# Patient Record
Sex: Male | Born: 1955
Health system: Southern US, Community
[De-identification: ages and names within clinical notes are randomized; demographics above are authoritative.]

## PROBLEM LIST (undated history)

## (undated) DIAGNOSIS — M1990 Unspecified osteoarthritis, unspecified site: Secondary | ICD-10-CM

## (undated) DIAGNOSIS — R350 Frequency of micturition: Secondary | ICD-10-CM

## (undated) DIAGNOSIS — R519 Headache, unspecified: Secondary | ICD-10-CM

## (undated) DIAGNOSIS — G4730 Sleep apnea, unspecified: Secondary | ICD-10-CM

## (undated) DIAGNOSIS — K219 Gastro-esophageal reflux disease without esophagitis: Secondary | ICD-10-CM

## (undated) DIAGNOSIS — Z87442 Personal history of urinary calculi: Secondary | ICD-10-CM

## (undated) DIAGNOSIS — K56609 Unspecified intestinal obstruction, unspecified as to partial versus complete obstruction: Secondary | ICD-10-CM

## (undated) DIAGNOSIS — R51 Headache: Secondary | ICD-10-CM

## (undated) HISTORY — PX: CYST EXCISION: SHX5701

## (undated) HISTORY — PX: HERNIA REPAIR: SHX51

---

## 1999-11-07 ENCOUNTER — Ambulatory Visit (HOSPITAL_COMMUNITY): Admission: RE | Admit: 1999-11-07 | Discharge: 1999-11-07 | Payer: Self-pay | Admitting: Cardiology

## 2004-04-22 ENCOUNTER — Ambulatory Visit: Payer: Self-pay | Admitting: Pulmonary Disease

## 2004-04-22 ENCOUNTER — Ambulatory Visit: Admission: RE | Admit: 2004-04-22 | Discharge: 2004-04-22 | Payer: Self-pay | Admitting: *Deleted

## 2004-05-15 ENCOUNTER — Ambulatory Visit: Payer: Self-pay | Admitting: Pulmonary Disease

## 2004-05-30 ENCOUNTER — Other Ambulatory Visit: Admission: RE | Admit: 2004-05-30 | Discharge: 2004-05-30 | Payer: Self-pay | Admitting: Dermatology

## 2007-02-24 ENCOUNTER — Ambulatory Visit: Payer: Self-pay | Admitting: Cardiovascular Disease

## 2007-02-25 ENCOUNTER — Encounter: Payer: Self-pay | Admitting: Cardiovascular Disease

## 2007-02-25 ENCOUNTER — Ambulatory Visit: Payer: Self-pay | Admitting: Cardiology

## 2007-02-25 ENCOUNTER — Encounter (HOSPITAL_COMMUNITY): Admission: RE | Admit: 2007-02-25 | Discharge: 2007-03-27 | Payer: Self-pay | Admitting: Cardiovascular Disease

## 2007-02-26 ENCOUNTER — Ambulatory Visit: Payer: Self-pay | Admitting: Pulmonary Disease

## 2007-04-01 ENCOUNTER — Ambulatory Visit: Payer: Self-pay | Admitting: Pulmonary Disease

## 2007-07-06 ENCOUNTER — Ambulatory Visit (HOSPITAL_COMMUNITY): Admission: RE | Admit: 2007-07-06 | Discharge: 2007-07-06 | Payer: Self-pay | Admitting: General Surgery

## 2007-10-11 ENCOUNTER — Encounter: Payer: Self-pay | Admitting: Pulmonary Disease

## 2007-11-15 ENCOUNTER — Encounter: Payer: Self-pay | Admitting: Pulmonary Disease

## 2007-11-15 DIAGNOSIS — R519 Headache, unspecified: Secondary | ICD-10-CM | POA: Insufficient documentation

## 2007-11-15 DIAGNOSIS — E785 Hyperlipidemia, unspecified: Secondary | ICD-10-CM | POA: Insufficient documentation

## 2007-11-15 DIAGNOSIS — R51 Headache: Secondary | ICD-10-CM | POA: Insufficient documentation

## 2007-11-15 DIAGNOSIS — I2510 Atherosclerotic heart disease of native coronary artery without angina pectoris: Secondary | ICD-10-CM | POA: Insufficient documentation

## 2007-11-15 DIAGNOSIS — G4730 Sleep apnea, unspecified: Secondary | ICD-10-CM | POA: Insufficient documentation

## 2008-01-10 ENCOUNTER — Ambulatory Visit (HOSPITAL_COMMUNITY): Admission: RE | Admit: 2008-01-10 | Discharge: 2008-01-10 | Payer: Self-pay | Admitting: General Surgery

## 2008-04-25 ENCOUNTER — Ambulatory Visit (HOSPITAL_COMMUNITY): Admission: RE | Admit: 2008-04-25 | Discharge: 2008-04-25 | Payer: Self-pay | Admitting: Family Medicine

## 2008-10-09 IMAGING — NM NM MYOCAR MULTI W/ SPECT
2 series · 12 of 12 positions shown · non-contrast
Comparison: none

CLINICAL DATA: 51-year-old gentleman with no known coronary disease presenting with chest pain.   
 STRESS MYOVIEW:
 Radionuclide Data:  One day rest/stress protocol performed with [DATE] mCi Mc-OOm Myoview.
 Stress Data:  Treadmill exercise performed to a workload of 12 mets and a heart rate of 155, 91% of age-predicted maximum.  Exercise discontinued due to fatigue and dyspnea; no chest pain reported.  Blood pressure increased from a resting value of 120/80 to 170/80 during exercise and 180/80 early in recovery, a normal response.  No arrhythmias noted.
 EKG:  Normal sinus rhythm; slightly delayed R-wave progression; right ventricular conduction delay; otherwise unremarkable.
 Stress EKG:  Insignificant upsloping ST segment depression.
 Scintigraphic Data:  Acquisition notable for mild diaphragmatic attenuation.  The left ventricular size was normal.  On tomographic images reconstructed in standard planes, there was basically normal myocardial perfusion.  Minor apical thinning was present that was not numerically significant and for which no reversibility was apparent.  The gated reconstruction demonstrated normal regional and global LV systolic function as well as normal systolic accentuation of activity throughout.  Estimated ejection fraction was 0.61.

[Series 1: cardiac perfusion · 6.39mm/px · 6 of 64 frames shown]
[frame 6/64]
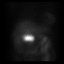
[frame 16/64]
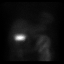
[frame 27/64]
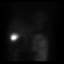
[frame 38/64]
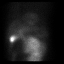
[frame 48/64]
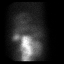
[frame 59/64]
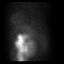

[Series 1: cs cardiac tc hi dose · 6.52mm/px · 6 of 512 frames shown]
[frame 43/512]
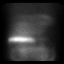
[frame 128/512]
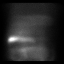
[frame 214/512]
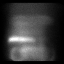
[frame 299/512]
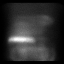
[frame 384/512]
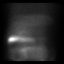
[frame 470/512]
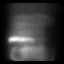

[12 of 12 positions shown; findings below may reference images not displayed]

IMPRESSION: Negative stress Myoview examination revealing good exercise tolerance, normal left ventricular size, normal left ventricular systolic function and neither electrocardiographic nor scintigraphic evidence for myocardial ischemia or infarction.  Physiological apical thinning.  Other findings as noted.

## 2008-12-11 DIAGNOSIS — R002 Palpitations: Secondary | ICD-10-CM | POA: Insufficient documentation

## 2008-12-11 DIAGNOSIS — R079 Chest pain, unspecified: Secondary | ICD-10-CM | POA: Insufficient documentation

## 2010-09-26 ENCOUNTER — Ambulatory Visit (INDEPENDENT_AMBULATORY_CARE_PROVIDER_SITE_OTHER): Payer: Federal, State, Local not specified - PPO | Admitting: Internal Medicine

## 2010-09-26 DIAGNOSIS — R1310 Dysphagia, unspecified: Secondary | ICD-10-CM

## 2010-10-08 NOTE — Consult Note (Signed)
NAME:  Kevin Patton, Kevin Patton             ACCOUNT NO.:  [id number]  MEDICAL RECORD NO.:  [id number]          PATIENT TYPE:  LOCATION:                                 FACILITY:  PHYSICIAN:  Lionel December, M.D.    DATE OF BIRTH:  1956-03-07  DATE OF CONSULTATION: DATE OF DISCHARGE:                                CONSULTATION   REASON FOR CONSULTATION:  Dysphagia.  HISTORY OF PRESENT ILLNESS:  Kevin Patton is a 55 year old white male referred to our office by Dr. Regino Schultze.  Kevin Patton states foods are occasionally lodging in his esophagus.  He states foods are lodging in his lower esophagus.  He does occasionally have to vomit his food back up.  He does occasionally have acid reflux.  He states the dysphagia is occurring about every 2 weeks.  He says approximately 5 times he has had to vomit the bolus back up if it would not go down.  He says his appetite is good.  He has had no weight loss.  He does occasionally have heartburn.  He has had no melena or bright red rectal bleeding.  There has been no weight loss.  He usually has a bowel movement once a day. There are no known allergies.  HOME MEDICATIONS: 1. Ropinirole 2 mg at night. 2. Simvastatin 20 mg a day. 3. Slo-Niacin 500 mg 3 a day. 4. Vitamin D3 1000 units a day. 5. MVI a day. 6. Aspirin 325 a day. 7. Caffeine 1000 mg a day.  SURGERY:  He had an umbilical hernia repair 3 years ago.  He has had a pilonidal cyst repaired for I and D.  MEDICAL HISTORY:  High cholesterol, sleep apnea, frequent headaches and restless leg syndrome.  FAMILY HISTORY:  His mother deceased from pulmonary issues.  Father is deceased from an enlarged heart.  One sister with back problems.  One brother in good health.  SOCIAL HISTORY:  He is married.  He works at the post office.  He does not smoke, drink or do drugs.  He has three children in good health. One child does however have seizures.  OBJECTIVE:  VITAL SIGNS:  His weight is 250, his height is 6  feet 2 inches, his temperature is 98.5, his blood pressure is 104/76, his pulse is 72. HEENT:  He has natural teeth in good condition.  His oral mucosa is moist.  There are no lesions.  His conjunctivae are pink.  His sclerae are anicteric. NECK:  His thyroid is normal.  There is no cervical lymphadenopathy. LUNGS:  Clear. HEART:  Regular, rate and rhythm. ABDOMEN:  Soft.  Bowel sounds are positive.  No masses. EXTREMITIES:  There is no edema to his extremities.  ASSESSMENT:  Kevin Patton is a 55 year old male presenting with solid food dysphagia.  An esophageal stricture web needs to be ruled out.  RECOMMENDATIONS:  We will start him on omeprazole 40 mg 30 minutes before breakfast #30 with 11 refills and I will also schedule an EGD/ED with Dr. Karilyn Cota in the near future and the risk and benefits were reviewed with the patient and he is agreeable.    ______________________________ Camelia Eng  Valetta Fuller, NP   ______________________________ Lionel December, M.D.    TS/MEDQ  D:  09/27/2010  T:  09/28/2010  Job:  [id number]  cc:   Kirk Ruths, M.D. Fax: [phone]  Electronically Signed by Dorene Ar PA on 10/02/2010 04:59:47 PM Electronically Signed by Lionel December M.D. on 10/08/2010 11:35:14 PM

## 2010-11-07 ENCOUNTER — Ambulatory Visit (HOSPITAL_COMMUNITY)
Admission: RE | Admit: 2010-11-07 | Discharge: 2010-11-07 | Disposition: A | Discharge status: Home / Self Care | Payer: Federal, State, Local not specified - PPO | Source: Ambulatory Visit | Attending: Internal Medicine | Admitting: Internal Medicine

## 2010-11-07 ENCOUNTER — Encounter (HOSPITAL_BASED_OUTPATIENT_CLINIC_OR_DEPARTMENT_OTHER): Payer: Federal, State, Local not specified - PPO | Admitting: Internal Medicine

## 2010-11-07 ENCOUNTER — Other Ambulatory Visit (INDEPENDENT_AMBULATORY_CARE_PROVIDER_SITE_OTHER): Payer: Self-pay | Admitting: Internal Medicine

## 2010-11-07 DIAGNOSIS — K209 Esophagitis, unspecified without bleeding: Secondary | ICD-10-CM

## 2010-11-07 DIAGNOSIS — R1310 Dysphagia, unspecified: Secondary | ICD-10-CM | POA: Insufficient documentation

## 2010-11-07 DIAGNOSIS — K2970 Gastritis, unspecified, without bleeding: Secondary | ICD-10-CM

## 2010-11-07 DIAGNOSIS — E785 Hyperlipidemia, unspecified: Secondary | ICD-10-CM | POA: Insufficient documentation

## 2010-11-08 LAB — H. PYLORI ANTIBODY, IGG: H Pylori IgG: 0.45 {ISR}

## 2010-11-12 NOTE — Assessment & Plan Note (Signed)
[facility]                             PULMONARY OFFICE NOTE   OLLIVER, BOYADJIAN                      MRN:          [id number]  DATE:02/26/2007                            DOB:          [birth year]    REFERRING PHYSICIAN:  Noralyn Pick. Eden Emms, MD, [facility]   HISTORY OF PRESENT ILLNESS:  The patient is a 55 year old gentleman who  comes in today for further treatment and evaluation of obstructive sleep  apnea.  I have apparently seen him in the past for mild to moderate  obstructive apnea.  However, there is no sleep study or prior sleep  notes available with the chart in [place].  The patient states that  he was on CPAP for about 8-10 months, and he stopped it because he just  felt like he was not doing any better and was having tolerance issues.  Again I have no way of knowing whether he failed to follow up with [place] to  troubleshoot the device or whether we had tried various things to try  and make tolerance better.  The patient has been off CPAP for about a  year and a half to two years.  The patient states that he continues to  have pauses in his breathing during sleep and is snoring currently.  He  is having definite daytime sleepiness, and he is drinking a lot of  coffee in order to stay awake at work.  He typically goes to bed between  10 and 12 and gets up at 7 a.m. to start his day.  He is obviously not  rested.  The patient also gives symptoms for the restless leg syndrome  in the evenings and also with leg jerks and abnormal sensations as he is  going off to sleep.  It is unclear to him whether he does kick during  the night.  His sleep study did apparently show large numbers of leg  jerks.  In terms of CPAP intolerance the patient states that he used a  nasal mask, but thinks that he would do better with a full face mask if  we tried again.  He stated that sound bothered his wife, and he reminded  me that the CPAP machine he used was an old one  from when his dad was  alive.  He also had sleep apnea.  He never did have his own machine.  He  does describe problems with exhalation, and it sounds from his history  that we really never got him optimally titrated and that he had  difficulties with air hunger on inspiration.  Note the patient's weight  is up about 20 pounds over the last two years.   PAST MEDICAL HISTORY:  Noted for:  1. Dyslipidemia.  2. History of chronic headaches.  3. He denies any other medical history to me.  It is really unclear      why he has seen cardiology up in [place].   CURRENT MEDICATIONS:  1. Niaspan 500 daily.  2. Zocor 20 mg daily.  3. Aspirin 325 daily.  4. Multivitamin daily.   ALLERGIES:  The patient has no known drug allergies.   SOCIAL HISTORY:  He is married, has children.  He has never smoked.   FAMILY HISTORY:  Remarkable for his father having heart disease as well  as prostate cancer.   REVIEW OF SYSTEMS:  As per History of Present Illness.  Also see patient  intake form documented on the chart.   PHYSICAL EXAMINATION:  GENERAL:  He is an overweight male in no acute  distress.  VITAL SIGNS:  Blood pressure 124/80, pulse 69, temperature 98.4, weight  241 pounds, 6 feet 2 inches tall, O2 saturation on room air 95%.  HEENT:  Pupils equal, round, reactive to light and accommodation,  extraocular muscles are intact.  Nares show septal deviation at the left  with total obstruction over the left side.  Oropharynx does show  moderate elongation of soft palate and uvula.  NECK:  Supple without JVD or lymphadenopathy, no palpable thyromegaly.  CHEST:  Totally clear.  CARDIAC:  Reveals regular rate and rhythm, no murmurs, rubs or gallops.  ABDOMEN:  Soft, nontender with good bowel sounds.  GENITAL/RECTAL/BREASTS:  Not done and not indicated.  EXTREMITIES:  Lower extremities are without edema, pulses are intact  distally.  NEUROLOGIC:  Alert, oriented with no obvious motor  deficits.   IMPRESSION:  History of what the patient believes is mild to moderate  obstructive sleep apnea.  We do need to get his old data as well as my  office notes to verify this.  He has had difficulties with CPAP part of  which I believe is trouble on exhalation as well as possibly never  having gotten his pressure titrated optimally.  He also started out on a  nasal mask, but my guess is that he would do much better with a full  face mask at this time.  The patient is willing to continue wearing CPAP  in order to feel more rested the next day.   PLAN:  1. I have encouraged him to work on weight loss which will certainly      help things.  2. Will reinitiate positive pressure device with a full face mask.  3. Will go ahead and get the patient on an auto BiPAP rather than CPAP      in light of his difficulties with the pressure on exhalation.      Perhaps he will do better with this.  Once he has worn this for 3-4      weeks and we have troubleshooted all of his problems, will get a      download off that machine and get him on his own regular BiPAP      machine at home.  The patient is agreeable to this approach.  4. Follow up in four weeks or sooner if there are problems.     Barbaraann Share, MD,FCCP  Electronically Signed    [facility]/MedQ  DD: 02/26/2007  DT: 02/27/2007  Job #: [id number]   cc:   Noralyn Pick. Eden Emms, MD, [facility]

## 2010-11-12 NOTE — Assessment & Plan Note (Signed)
[facility]                       [place] CARDIOLOGY OFFICE NOTE   NOLON, YELLIN                      MRN:          [id number]  DATE:02/24/2007                            DOB:          1955-07-30    Kevin Patton is seen today as a new patient.  He had previously been  seen back in 2000/2001.  His primary care doctor is Dr. Regino Schultze.  The  patient came to the office to be seen for chest pain and palpitations.   The patient had a normal catheterization in May 2001.   His coronary risk factors include hypercholesterolemia and borderline  hypertension.   He is a nonsmoker, nondiabetic.  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  At that time, he noticed his heart racing.  He had  rapid palpitations for 10 to 15 minutes.  They slowly eased off on their  own.  Around the time he had his rapid palpitations, he had atypical  chest pain.  He noted sharp left-sided pain that radiated up to his  neck.  It seemed to get better as his palpitations went away.  The  patient thinks that possible recurrent sleep apnea and caffeine are  contributing to the problems.  He is having a lot of difficulty  sleeping.  He snores a lot.  He has seen Dr. Shelle Iron in the past, but  apparently had difficulties with wearing his mask, and has not done it.  He thinks this is a recurrent problem.  Because of his problem sleeping,  he has to drink a lot of caffeine to stay awake, and he thinks this may  be exacerbating his palpitations.   The patient has gotten recurrent pains without the palpitations.  They  have recurred over the weekend.  They are nonexertional.  They are  sharp.  They last a minute or 2.  He does not do anything to make them  better or worse.  The pain can radiate towards the neck, but sometimes  just stays in the chest.   The patient does not have a history of syncope, PND, or orthopnea.  There has been no previous cardiomyopathy or  heart problems.  As  indicated, he had a normal catheterization in 2001.  He has not had a  followup Cardiology visit or stress test since then.   REVIEW OF SYSTEMS:  Otherwise, remarkable for an occasional prostatism.  He is not on medication for it.   As indicated, the patient has not had a physical in 2 to 3 years, and I  encouraged him to follow up with Dr. Regino Schultze.   PAST MEDICAL HISTORY:  Remarkable for:  1. Hyperlipidemia.  2. Question of previous prostatism.  3. Sleep apnea.  4. Nonobstructive coronary disease in May 2001.   HE HAS NO KNOWN ALLERGIES.   MEDICATIONS:  Include:  1. Niaspan 1500 mg a day.  2. Zocor 20 a day.  3. Aspirin a day.   FAMILY HISTORY:  Noncontributory.  There is no premature coronary  disease.  His father did need heart valve surgery, but that was at  the  age of 57.   Patient is happily married.  He used to grow flowers commercially.  He  currently works for the post office.  He has 3 children who are doing  extremely well.  He likes to work around the house, and golfs  occasionally.  He does not drink or smoke.   EXAMINATION:  Remarkable for healthy-appearing middle-aged white [place]  male in no distress.  Affect is appropriate.  Weight is 243, which is up considerably since his last office visit.  Blood pressure is 130/90.  Pulse is 75 and regular.  He is afebrile.  Respiratory rate is 14.  HEENT:  Normal.  Carotids normal without bruits.  There is no lymphadenopathy.  No  thyromegaly.  No JVP elevation.  LUNGS:  Clear with good diaphragmatic motion.  No wheezing.  There is an S1 and S2 with normal heart sounds.  PMI is normal.  ABDOMEN:  Benign.  There is no tenderness.  Bowel sounds are positive.  No hepatosplenomegaly.  No hepatojugular reflux.  No AAA.  No bruits.  Femorals are +4 bilaterally without bruit.  PTs are +3.  There is no  lower extremity edema.  NEUROLOGIC:  Nonfocal.  There is no muscular weakness.  SKIN:  Warm and  dry.  EKG:  Shows sinus rhythm with left atrial enlargement, left axis  deviation, incomplete right bundle branch block.  It appears similar to  the one from January 2005 that was in the chart.   IMPRESSION:  1. Palpitations.  Likely benign.  Probably are related to increased      caffeine.  May be a short burst of paroxysmal ventricular      tachycardia.  Inderal p.r.n. 10 mg given to try in case they recur.      Cut down on caffeine.  2. Chest pain.  Atypical.  No acute EKG changes.  Followup stress      Myoview in the next week or so.  3. History of sleep apnea with loud snoring and poor sleep quality.      Follow up with Dr. Shelle Iron for repeat visit to see if he can wear      CPAP or needs further testing.  4. Hypercholesterolemia.  Continue Zocor and Niaspan.  Follow up with      Dr. Regino Schultze for lipid and liver profile.  5. Borderline hypertension, at least by exam.  I will leave this up to      Dr. Regino Schultze to monitor to see what he runs.  Given his propensity      for recent palpitations, the addition of low-dose Toprol at 25 mg a      day may be reasonable.   The patient will see [place] on a p.r.n. basis as long as his stress Myoview  is normal.  We will try to get him in to see Dr. Shelle Iron in the  [place] office for sleep apnea.     Noralyn Pick. Eden Emms, MD, [facility]  Electronically Signed    PCN/MedQ  DD: 02/24/2007  DT: 02/24/2007  Job #: [id number]

## 2010-11-12 NOTE — H&P (Signed)
NAME:  Kevin Patton, Kevin Patton             ACCOUNT NO.:  [id number]   MEDICAL RECORD NO.:  [id number]          PATIENT TYPE:  AMB   LOCATION:  DAY                           FACILITY:  [facility]   PHYSICIAN:  Dalia Heading, M.D.  DATE OF BIRTH:  30-Aug-1955   DATE OF ADMISSION:  DATE OF DISCHARGE:  LH                              HISTORY & PHYSICAL   CHIEF COMPLAINTS:  Umbilical hernia.   HISTORY OF PRESENT ILLNESS:  The patient is a 55 year old white male who  is referred for evaluation and treatment of an umbilical hernia.  It has  been present for sometime, but recently it has increased in size and it  is causing him discomfort.  No nausea or vomiting had been noted.  It is  made worse with straining.  It is more noticeable because he has lost  weight recently.   PAST MEDICAL HISTORY:  Includes restless leg syndrome.   PAST SURGICAL HISTORY:  Pilonidal cyst excision.   CURRENT MEDICATIONS:  Niaspan, Zocor, and baby aspirin.   ALLERGIES:  No known drug allergies.   REVIEW OF SYSTEMS:  Noncontributory.   PHYSICAL EXAMINATION:  GENERAL:  On physical examination, the patient is  well-developed, well-nourished white male in no acute distress.  LUNGS:  Clear to auscultation with equal breath sounds bilaterally.  HEART:  Regular, rate, and rhythm without S3, S4, and murmurs.  ABDOMEN:  Soft, nontender, and nondistended.  No hepatosplenomegaly or  masses are noted.  A 2-cm reducible umbilical hernia is present.   IMPRESSION:  Umbilical hernia.   PLAN:  The patient scheduled for the umbilical herniorrhaphy on January 10, 2008.  The risks and benefits of the procedure including bleeding,  infection, the possibility reoccurrence of the hernia were fully  explained to the patient, gave informed consent.      Dalia Heading, M.D.  Electronically Signed     MAJ/MEDQ  D:  12/28/2007  T:  12/29/2007  Job:  [id number]   cc:   Kirk Ruths, M.D.  Fax: [phone]

## 2010-11-12 NOTE — Op Note (Signed)
NAME:  Kevin Patton, Kevin Patton             ACCOUNT NO.:  [id number]   MEDICAL RECORD NO.:  [id number]          PATIENT TYPE:  AMB   LOCATION:  DAY                           FACILITY:  [facility]   PHYSICIAN:  Dalia Heading, M.D.  DATE OF BIRTH:  01/18/1956   DATE OF PROCEDURE:  01/10/2008  DATE OF DISCHARGE:                               OPERATIVE REPORT   PREOPERATIVE DIAGNOSIS:  Umbilical hernia.   POSTOPERATIVE DIAGNOSIS:  Umbilical hernia.   PROCEDURE:  Umbilical herniorrhaphy.   SURGEON:  Dalia Heading, MD   ANESTHESIA:  General endotracheal.   INDICATIONS:  The patient is a 55 year old white male presents with  symptomatic umbilical hernia.  The risks and benefits of the procedure  including bleeding, infection, and recurrence of the hernia were fully  explained to the patient, gave informed consent.   PROCEDURE NOTE:  The patient was placed in supine position.  After  induction of general endotracheal anesthesia, the abdomen was prepped  and draped in the usual sterile technique with Betadine.  Surgical site  confirmation was performed.   An infraumbilical incision was made down to the fascia.  The umbilicus  was freed away from the underlying fascia.  The hernia sac was excised  at its base.  Omentum was noted to be within the hernia sac and this was  reduced without difficulty.  The defect was approximately 1 cm in its  greatest diameter.  This was closed primarily using 0 Ethilon  interrupted sutures.  The base of the umbilicus was secured back to the  fascia using 2-0 Vicryl interrupted suture.  Subcutaneous layer was  reapproximated using a 3-0 Vicryl interrupted suture.  The skin was  closed using staples.  A 0.5% Sensorcaine was instilled in the  surrounding wound.  Betadine ointment and dry sterile dressing were  applied.   All tape and needle counts were correct at the end of the procedure.  The patient was awakened and transferred to PACU in stable condition.   COMPLICATIONS:  None.   SPECIMEN:  None.   BLOOD LOSS:  Minimal.      Dalia Heading, M.D.  Electronically Signed     MAJ/MEDQ  D:  01/10/2008  T:  01/10/2008  Job:  [id number]   cc:   Kirk Ruths, M.D.  Fax: [phone]

## 2010-11-12 NOTE — H&P (Signed)
NAME:  Kevin Patton, Kevin Patton             ACCOUNT NO.:  [id number]   MEDICAL RECORD NO.:  [id number]          PATIENT TYPE:  AMB   LOCATION:  DAY                           FACILITY:  [facility]   PHYSICIAN:  Dalia Heading, M.D.  DATE OF BIRTH:  18-Jul-1955   DATE OF ADMISSION:  07/06/2007  DATE OF DISCHARGE:  LH                              HISTORY & PHYSICAL   CHIEF COMPLAINT:  Need for screening colonoscopy.   HISTORY OF PRESENT ILLNESS:  Patient is a 55 year old white male who was  referred for endoscopic evaluation.  He needs a colonoscopy for  screening purposes.  No abdominal pain, weight loss, nausea, vomiting,  diarrhea, constipation, melena, or hematochezia.  He has never had a  colonoscopy.  There is no family history of colon carcinoma.   PAST MEDICAL HISTORY:  Restless legs syndrome.   PAST SURGICAL HISTORY:  Pilonidal cyst.   CURRENT MEDICATIONS:  Niaspan, Zocor, aspirin.   ALLERGIES:  No known drug allergies.   REVIEW OF SYSTEMS:  Noncontributory.   PHYSICAL EXAMINATION:  Patient is a well-developed and well-nourished  white male in no acute distress.  LUNGS:  Clear to auscultation with equal breath sounds bilaterally.  HEART:  Regular rate and rhythm without S3, S4, or murmurs.  ABDOMEN:  Soft, nontender, nondistended.  No hepatosplenomegaly or  masses are noted.  RECTAL:  Deferred to the procedure.   IMPRESSION:  Need for screening colonoscopy.   PLAN:  Patient is scheduled for a colonoscopy for July 06, 2007.  The  risks and benefits of the procedure, including bleeding, infection, were  fully explained to the patient, who gave informed consent.      Dalia Heading, M.D.  Electronically Signed     MAJ/MEDQ  D:  06/17/2007  T:  06/17/2007  Job:  [id number]   cc:   [facility]  Fax: [phone]   Kirk Ruths, M.D.  Fax: [phone]

## 2010-11-15 NOTE — Cardiovascular Report (Signed)
[place]. [facility]  Patient:    Kevin Patton, Kevin Patton                      MRN: [id number] Proc. Date: 11/07/99 Adm. Date:  [id number] Disc. Date: [id number] Attending:  Lenoria Farrier CC:         Jesse Sans. Wall, M.D. [facility]             Buck Mam, M.D.             Cardiac Catheterization Laboratory                        Cardiac Catheterization  PROCEDURES PERFORMED:  Left heart catheterization with coronary angiography and left ventriculography.  INDICATIONS:  Mr. Rogan is a 55 year old male who presented to [facility] with chest pain.  A stress Cardiolite was suggestive of possible intra-apical ischemia.  He was referred for cardiac catheterization.  DESCRIPTION OF PROCEDURE:  A 6-French sheath was placed in the right femoral artery.  Standard Judkins 6-French catheters were utilized.  Contrast was Omnipaque.  There were no complications.  RESULTS:  HEMODYNAMICS:  Left ventricular pressure 122/15.  Aortic pressure 118/82.  No aortic valve gradient.  LEFT VENTRICULOGRAM:  Wall motion is normal.  Ejection fraction is calculated at 64%.  CORONARY ARTERIOGRAPHY (Right dominant): 1. The left main is normal.  2. The left anterior descending artery has minor luminal irregularities in the    mid vessel.  It gives rise to a normal first diagonal branch.  3. The left circumflex gives rise to a small ramus intermedius, a very small    first obtuse marginal branch, a normal second obtuse marginal branch, and a    large third obtuse marginal branch.  The first obtuse marginal, which is    less than 1 mm in diameter has what appears to be an 80% stenosis at its    ostium.  The left circumflex is otherwise free of angiographic disease.  4. The right coronary artery gives rise to a small posterior descending artery    and two small posterolateral branches.  The right coronary artery is free    of angiographic disease.  IMPRESSIONS: 1. Normal left  ventricular systolic function. 2. Insignificant coronary artery disease characterized by an 80% stenosis    at the ostium of a very small obtuse marginal branch.  RECOMMENDATIONS:  Medical therapy.DD:  11/07/99 TD:  11/09/99 Job: [id number]

## 2010-11-15 NOTE — Procedures (Signed)
NAME:  Kevin Patton, Kevin Patton             ACCOUNT NO.:  [id number]   MEDICAL RECORD NO.:  [id number]          PATIENT TYPE:  OUT   LOCATION:  SLEEP LAB                     FACILITY:  [facility]   PHYSICIAN:  Marcelyn Bruins, M.D. [facility] DATE OF BIRTH:  1955/09/24   DATE OF STUDY:  04/22/2004                              NOCTURNAL POLYSOMNOGRAM   REFERRING PHYSICIAN:  Marcelyn Bruins, M.D.   DATE OF STUDY:  April 22, 2004   INDICATION FOR THE STUDY:  Hypersomnia with sleep apnea.   SLEEP ARCHITECTURE:  The patient had a total sleep time of 348 minutes with  a sleep efficiency of 90%.  There was decreased REM and slow wave sleep  noted.  Sleep onset latency was normal and REM latency was fairly rapid.   IMPRESSION:  1.  Mild to moderate obstructive sleep apnea/hypopnea syndrome with a      respiratory disturbance index of 17 events/hr and oxygen desaturation as      low as 85%.  Events were not positional.  2.  Loud snoring noted throughout the study.  3.  No clinically significant cardiac arrhythmia.  4.  Large numbers of leg jerks with significant sleep disruption.  Clinical      correlation is suggested.      KC/MEDQ  D:  05/09/2004 12:09:15  T:  05/09/2004 20:17:19  Job:  [id number]

## 2010-11-25 NOTE — Op Note (Signed)
NAME:  Kevin Patton, Kevin Patton             ACCOUNT NO.:  [id number]  MEDICAL RECORD NO.:  [id number]           PATIENT TYPE:  O  LOCATION:  DAYP                          FACILITY:  [facility]  PHYSICIAN:  Lionel December, M.D.    DATE OF BIRTH:  [birth year]  DATE OF PROCEDURE:  11/07/2010 DATE OF DISCHARGE:                              OPERATIVE REPORT   PROCEDURE:  Esophagogastroduodenoscopy with esophageal dilation.  INDICATION:  [name] is a 55 year old Caucasian male who has had intermittent solid food dysphagia for the past couple of years.  Daily he has had few episodes of food impaction relieved with regurgitation. He points to his upper sternal area in the site of bolus obstruction. He states he may have heartburn 3 times in his lifetime.  He also tells me that his older brother has Barrett esophagus.  Procedure risks were reviewed with the patient and informed consent was obtained.  MEDICATIONS FOR CONSCIOUS SEDATION: 1. Cetacaine spray for pharyngeal topical anesthesia. 2. Demerol 50 mg IV. 3. Versed 5 mg IV.  FINDINGS:  Procedure performed in endoscopy suite.  The patient's vital signs and O2 sats were monitored during the procedure and remained stable.  The patient was placed in left lateral recumbent position and Pentax videoscope was passed through oropharynx without any difficulty into esophagus.  Esophagus.  Mucosa of the esophagus normal.  Body was somewhat dilated, but there was no distal narrowing, stricture or ring.  The single patch of pink salmon-colored mucosa at distal esophagus about 10 x 12 mm intake was with the GE junction.  This area was biopsied on the way out. GE junction was located at 43-cm from the incisors and was unremarkable. No hernia was noted.  Stomach.  It was empty and distended very well with insufflation.  Folds of the proximal stomach are normal.  Examination of mucosa at body was normal.  Multiple antral erosions were noted.  However, no ulcer  crater was present.  Pyloric channel was patent.  Angularis, fundus and cardia were examined by retroflexing scope and were normal.  Duodenum.  Bulbar mucosa was normal.  Scope was passed into the second part of the duodenal mucosa and folds were normal.  Endoscope was withdrawn.  Esophagus was dilated by passing 56-French Maloney dilator to full insertion.  No resistance was noted.  As the dilators were withdrawn, endoscope was passed again and no mucosal disruption noted.  On the way out, biopsy was taken from this patch of mucosa at distal esophagus as described above.  The patient tolerated the procedure well.  FINAL DIAGNOSES: 1. No evidence of erosive esophagitis, ring or stricture. 2. Somewhat dilated body of the esophagus. 3. Single patch of salmon-colored mucosa at distal esophagus which was     biopsied to rule out short segment Barrett. 4. Multiple antral erosions. 5. Esophagus dilated by passing 56-French Maloney dilator, but no     mucosal disruption noted.  RECOMMENDATIONS: 1. He will continue omeprazole at 40 mg p.o. q.a.m. for now. 2. I will be contacting the patient with results of biopsy and further     recommendations.  ______________________________ Lionel December, M.D.     NR/MEDQ  D:  11/07/2010  T:  11/08/2010  Job:  [id number]  cc:   Kirk Ruths, M.D. Fax: [phone]  Electronically Signed by Lionel December M.D. on 11/25/2010 06:37:10 PM

## 2011-03-27 LAB — BASIC METABOLIC PANEL
Calcium: 9.4
Chloride: 106
Creatinine, Ser: 0.74
GFR calc non Af Amer: >60
Glucose, Bld: 107 — ABNORMAL HIGH
Potassium: 4

## 2011-03-27 LAB — CBC
Hemoglobin: 14.5
MCHC: 33.5
Platelets: 167
RDW: 13.1

## 2012-11-16 ENCOUNTER — Other Ambulatory Visit: Payer: Self-pay

## 2012-11-16 MED ORDER — TOPIRAMATE 50 MG PO TABS: 100.0000 mg | ORAL_TABLET | Freq: Two times a day (BID) | ORAL | Status: DC

## 2016-01-21 DIAGNOSIS — K080 Exfoliation of teeth due to systemic causes: Secondary | ICD-10-CM | POA: Diagnosis not present

## 2016-03-06 DIAGNOSIS — G4733 Obstructive sleep apnea (adult) (pediatric): Secondary | ICD-10-CM | POA: Diagnosis not present

## 2016-07-23 DIAGNOSIS — G4733 Obstructive sleep apnea (adult) (pediatric): Secondary | ICD-10-CM | POA: Diagnosis not present

## 2016-08-05 DIAGNOSIS — Z23 Encounter for immunization: Secondary | ICD-10-CM | POA: Diagnosis not present

## 2016-08-05 DIAGNOSIS — G44209 Tension-type headache, unspecified, not intractable: Secondary | ICD-10-CM | POA: Diagnosis not present

## 2016-08-05 DIAGNOSIS — Z0000 Encounter for general adult medical examination without abnormal findings: Secondary | ICD-10-CM | POA: Diagnosis not present

## 2016-08-05 DIAGNOSIS — Z6832 Body mass index (BMI) 32.0-32.9, adult: Secondary | ICD-10-CM | POA: Diagnosis not present

## 2016-08-05 DIAGNOSIS — Z1389 Encounter for screening for other disorder: Secondary | ICD-10-CM | POA: Diagnosis not present

## 2016-08-05 DIAGNOSIS — E6609 Other obesity due to excess calories: Secondary | ICD-10-CM | POA: Diagnosis not present

## 2016-08-05 DIAGNOSIS — E782 Mixed hyperlipidemia: Secondary | ICD-10-CM | POA: Diagnosis not present

## 2016-08-11 DIAGNOSIS — R52 Pain, unspecified: Secondary | ICD-10-CM | POA: Diagnosis not present

## 2016-08-11 DIAGNOSIS — Z1389 Encounter for screening for other disorder: Secondary | ICD-10-CM | POA: Diagnosis not present

## 2016-08-11 DIAGNOSIS — Z23 Encounter for immunization: Secondary | ICD-10-CM | POA: Diagnosis not present

## 2016-08-11 DIAGNOSIS — W57XXXD Bitten or stung by nonvenomous insect and other nonvenomous arthropods, subsequent encounter: Secondary | ICD-10-CM | POA: Diagnosis not present

## 2016-08-18 DIAGNOSIS — Z049 Encounter for examination and observation for unspecified reason: Secondary | ICD-10-CM | POA: Diagnosis not present

## 2016-08-18 DIAGNOSIS — G43719 Chronic migraine without aura, intractable, without status migrainosus: Secondary | ICD-10-CM | POA: Diagnosis not present

## 2016-08-18 DIAGNOSIS — R51 Headache: Secondary | ICD-10-CM | POA: Diagnosis not present

## 2016-08-18 DIAGNOSIS — Z79899 Other long term (current) drug therapy: Secondary | ICD-10-CM | POA: Diagnosis not present

## 2016-09-02 DIAGNOSIS — M791 Myalgia: Secondary | ICD-10-CM | POA: Diagnosis not present

## 2016-09-02 DIAGNOSIS — M542 Cervicalgia: Secondary | ICD-10-CM | POA: Diagnosis not present

## 2016-09-02 DIAGNOSIS — G43719 Chronic migraine without aura, intractable, without status migrainosus: Secondary | ICD-10-CM | POA: Diagnosis not present

## 2016-09-02 DIAGNOSIS — G518 Other disorders of facial nerve: Secondary | ICD-10-CM | POA: Diagnosis not present

## 2016-09-23 DIAGNOSIS — M542 Cervicalgia: Secondary | ICD-10-CM | POA: Diagnosis not present

## 2016-09-23 DIAGNOSIS — M791 Myalgia: Secondary | ICD-10-CM | POA: Diagnosis not present

## 2016-09-23 DIAGNOSIS — G518 Other disorders of facial nerve: Secondary | ICD-10-CM | POA: Diagnosis not present

## 2016-09-23 DIAGNOSIS — G43719 Chronic migraine without aura, intractable, without status migrainosus: Secondary | ICD-10-CM | POA: Diagnosis not present

## 2016-10-07 DIAGNOSIS — M791 Myalgia: Secondary | ICD-10-CM | POA: Diagnosis not present

## 2016-10-07 DIAGNOSIS — G518 Other disorders of facial nerve: Secondary | ICD-10-CM | POA: Diagnosis not present

## 2016-10-07 DIAGNOSIS — G43719 Chronic migraine without aura, intractable, without status migrainosus: Secondary | ICD-10-CM | POA: Diagnosis not present

## 2016-10-07 DIAGNOSIS — M542 Cervicalgia: Secondary | ICD-10-CM | POA: Diagnosis not present

## 2016-10-27 DIAGNOSIS — M542 Cervicalgia: Secondary | ICD-10-CM | POA: Diagnosis not present

## 2016-10-27 DIAGNOSIS — G43719 Chronic migraine without aura, intractable, without status migrainosus: Secondary | ICD-10-CM | POA: Diagnosis not present

## 2016-10-27 DIAGNOSIS — M791 Myalgia: Secondary | ICD-10-CM | POA: Diagnosis not present

## 2016-10-27 DIAGNOSIS — G518 Other disorders of facial nerve: Secondary | ICD-10-CM | POA: Diagnosis not present

## 2016-11-11 DIAGNOSIS — G518 Other disorders of facial nerve: Secondary | ICD-10-CM | POA: Diagnosis not present

## 2016-11-11 DIAGNOSIS — M791 Myalgia: Secondary | ICD-10-CM | POA: Diagnosis not present

## 2016-11-11 DIAGNOSIS — G43719 Chronic migraine without aura, intractable, without status migrainosus: Secondary | ICD-10-CM | POA: Diagnosis not present

## 2016-11-11 DIAGNOSIS — M542 Cervicalgia: Secondary | ICD-10-CM | POA: Diagnosis not present

## 2016-11-13 DIAGNOSIS — Z6832 Body mass index (BMI) 32.0-32.9, adult: Secondary | ICD-10-CM | POA: Diagnosis not present

## 2016-11-13 DIAGNOSIS — J069 Acute upper respiratory infection, unspecified: Secondary | ICD-10-CM | POA: Diagnosis not present

## 2016-11-13 DIAGNOSIS — Z1389 Encounter for screening for other disorder: Secondary | ICD-10-CM | POA: Diagnosis not present

## 2016-11-26 DIAGNOSIS — M542 Cervicalgia: Secondary | ICD-10-CM | POA: Diagnosis not present

## 2016-11-26 DIAGNOSIS — M791 Myalgia: Secondary | ICD-10-CM | POA: Diagnosis not present

## 2016-11-26 DIAGNOSIS — G43719 Chronic migraine without aura, intractable, without status migrainosus: Secondary | ICD-10-CM | POA: Diagnosis not present

## 2016-11-26 DIAGNOSIS — G518 Other disorders of facial nerve: Secondary | ICD-10-CM | POA: Diagnosis not present

## 2017-01-08 DIAGNOSIS — G43719 Chronic migraine without aura, intractable, without status migrainosus: Secondary | ICD-10-CM | POA: Diagnosis not present

## 2017-02-26 DIAGNOSIS — G43719 Chronic migraine without aura, intractable, without status migrainosus: Secondary | ICD-10-CM | POA: Diagnosis not present

## 2017-04-22 DIAGNOSIS — K648 Other hemorrhoids: Secondary | ICD-10-CM | POA: Diagnosis not present

## 2017-04-22 DIAGNOSIS — G4733 Obstructive sleep apnea (adult) (pediatric): Secondary | ICD-10-CM | POA: Diagnosis not present

## 2017-05-06 DIAGNOSIS — K648 Other hemorrhoids: Secondary | ICD-10-CM | POA: Diagnosis not present

## 2017-05-20 DIAGNOSIS — K648 Other hemorrhoids: Secondary | ICD-10-CM | POA: Diagnosis not present

## 2017-06-15 DIAGNOSIS — K648 Other hemorrhoids: Secondary | ICD-10-CM | POA: Diagnosis not present

## 2017-06-15 DIAGNOSIS — K635 Polyp of colon: Secondary | ICD-10-CM | POA: Diagnosis not present

## 2017-06-15 DIAGNOSIS — D122 Benign neoplasm of ascending colon: Secondary | ICD-10-CM | POA: Diagnosis not present

## 2017-06-15 DIAGNOSIS — K5730 Diverticulosis of large intestine without perforation or abscess without bleeding: Secondary | ICD-10-CM | POA: Diagnosis not present

## 2017-06-15 DIAGNOSIS — Z1211 Encounter for screening for malignant neoplasm of colon: Secondary | ICD-10-CM | POA: Diagnosis not present

## 2017-07-15 DIAGNOSIS — G43719 Chronic migraine without aura, intractable, without status migrainosus: Secondary | ICD-10-CM | POA: Diagnosis not present

## 2017-07-31 DIAGNOSIS — K56609 Unspecified intestinal obstruction, unspecified as to partial versus complete obstruction: Secondary | ICD-10-CM

## 2017-07-31 HISTORY — DX: Unspecified intestinal obstruction, unspecified as to partial versus complete obstruction: K56.609

## 2017-08-23 ENCOUNTER — Inpatient Hospital Stay (HOSPITAL_COMMUNITY)
Admission: EM | Admit: 2017-08-23 | Discharge: 2017-08-25 | DRG: 372 | Disposition: A | Discharge status: Home / Self Care | Payer: Federal, State, Local not specified - PPO | Attending: Internal Medicine | Admitting: Internal Medicine

## 2017-08-23 ENCOUNTER — Encounter (HOSPITAL_COMMUNITY): Payer: Self-pay | Admitting: Emergency Medicine

## 2017-08-23 ENCOUNTER — Emergency Department (HOSPITAL_COMMUNITY): Payer: Federal, State, Local not specified - PPO

## 2017-08-23 DIAGNOSIS — N179 Acute kidney failure, unspecified: Secondary | ICD-10-CM | POA: Diagnosis not present

## 2017-08-23 DIAGNOSIS — K567 Ileus, unspecified: Secondary | ICD-10-CM | POA: Diagnosis present

## 2017-08-23 DIAGNOSIS — G43909 Migraine, unspecified, not intractable, without status migrainosus: Secondary | ICD-10-CM | POA: Diagnosis not present

## 2017-08-23 DIAGNOSIS — R112 Nausea with vomiting, unspecified: Secondary | ICD-10-CM

## 2017-08-23 DIAGNOSIS — R140 Abdominal distension (gaseous): Secondary | ICD-10-CM | POA: Diagnosis not present

## 2017-08-23 DIAGNOSIS — D751 Secondary polycythemia: Secondary | ICD-10-CM | POA: Diagnosis not present

## 2017-08-23 DIAGNOSIS — E860 Dehydration: Secondary | ICD-10-CM | POA: Diagnosis present

## 2017-08-23 DIAGNOSIS — A0811 Acute gastroenteropathy due to Norwalk agent: Secondary | ICD-10-CM | POA: Diagnosis not present

## 2017-08-23 DIAGNOSIS — E785 Hyperlipidemia, unspecified: Secondary | ICD-10-CM

## 2017-08-23 DIAGNOSIS — F419 Anxiety disorder, unspecified: Secondary | ICD-10-CM | POA: Diagnosis present

## 2017-08-23 DIAGNOSIS — R1084 Generalized abdominal pain: Secondary | ICD-10-CM | POA: Diagnosis not present

## 2017-08-23 DIAGNOSIS — A0819 Acute gastroenteropathy due to other small round viruses: Secondary | ICD-10-CM | POA: Diagnosis not present

## 2017-08-23 DIAGNOSIS — A044 Other intestinal Escherichia coli infections: Secondary | ICD-10-CM | POA: Diagnosis not present

## 2017-08-23 DIAGNOSIS — R933 Abnormal findings on diagnostic imaging of other parts of digestive tract: Secondary | ICD-10-CM

## 2017-08-23 DIAGNOSIS — Z7982 Long term (current) use of aspirin: Secondary | ICD-10-CM

## 2017-08-23 DIAGNOSIS — K56609 Unspecified intestinal obstruction, unspecified as to partial versus complete obstruction: Secondary | ICD-10-CM | POA: Diagnosis not present

## 2017-08-23 DIAGNOSIS — R1110 Vomiting, unspecified: Secondary | ICD-10-CM | POA: Diagnosis not present

## 2017-08-23 DIAGNOSIS — K56600 Partial intestinal obstruction, unspecified as to cause: Secondary | ICD-10-CM

## 2017-08-23 DIAGNOSIS — R197 Diarrhea, unspecified: Secondary | ICD-10-CM

## 2017-08-23 DIAGNOSIS — Z79899 Other long term (current) drug therapy: Secondary | ICD-10-CM

## 2017-08-23 DIAGNOSIS — E7800 Pure hypercholesterolemia, unspecified: Secondary | ICD-10-CM | POA: Diagnosis not present

## 2017-08-23 HISTORY — DX: Personal history of urinary calculi: Z87.442

## 2017-08-23 HISTORY — DX: Unspecified intestinal obstruction, unspecified as to partial versus complete obstruction: K56.609

## 2017-08-23 HISTORY — DX: Headache, unspecified: R51.9

## 2017-08-23 HISTORY — DX: Headache: R51

## 2017-08-23 HISTORY — DX: Sleep apnea, unspecified: G47.30

## 2017-08-23 HISTORY — DX: Unspecified osteoarthritis, unspecified site: M19.90

## 2017-08-23 HISTORY — DX: Gastro-esophageal reflux disease without esophagitis: K21.9

## 2017-08-23 LAB — COMPREHENSIVE METABOLIC PANEL
ALBUMIN: 3.9 g/dL (ref 3.5–5.0)
ALK PHOS: 58 U/L (ref 38–126)
ALT: 30 U/L (ref 17–63)
AST: 33 U/L (ref 15–41)
Anion gap: 12 (ref 5–15)
BILIRUBIN TOTAL: 0.7 mg/dL (ref 0.3–1.2)
BUN: 35 mg/dL — AB (ref 6–20)
CALCIUM: 8.5 mg/dL — AB (ref 8.9–10.3)
CO2: 22 mmol/L (ref 22–32)
CREATININE: 1.29 mg/dL — AB (ref 0.61–1.24)
Chloride: 103 mmol/L (ref 101–111)
GFR calc Af Amer: >60 mL/min (ref >60)
GFR calc non Af Amer: 58 mL/min — ABNORMAL LOW (ref >60)
Glucose, Bld: 135 mg/dL — ABNORMAL HIGH (ref 65–99)
Potassium: 4 mmol/L (ref 3.5–5.1)
SODIUM: 137 mmol/L (ref 135–145)
TOTAL PROTEIN: 7.5 g/dL (ref 6.5–8.1)

## 2017-08-23 LAB — URINALYSIS, ROUTINE W REFLEX MICROSCOPIC
BILIRUBIN URINE: NEGATIVE
Bacteria, UA: NONE SEEN
Glucose, UA: NEGATIVE mg/dL
Hgb urine dipstick: NEGATIVE
KETONES UR: NEGATIVE mg/dL
Leukocytes, UA: NEGATIVE
Nitrite: NEGATIVE
Protein, ur: 100 mg/dL — AB
SPECIFIC GRAVITY, URINE: 1.031 — AB (ref 1.005–1.030)
Squamous Epithelial / LPF: NONE SEEN
pH: 5 (ref 5.0–8.0)

## 2017-08-23 LAB — BRAIN NATRIURETIC PEPTIDE: B Natriuretic Peptide: 22.3 pg/mL (ref 0.0–100.0)

## 2017-08-23 LAB — CBC
HCT: 49.8 % (ref 39.0–52.0)
HEMOGLOBIN: 17.3 g/dL — AB (ref 13.0–17.0)
MCH: 32 pg (ref 26.0–34.0)
MCHC: 34.7 g/dL (ref 30.0–36.0)
MCV: 92.1 fL (ref 78.0–100.0)
Platelets: 185 10*3/uL (ref 150–400)
RBC: 5.41 MIL/uL (ref 4.22–5.81)
RDW: 13.4 % (ref 11.5–15.5)
WBC: 8.5 10*3/uL (ref 4.0–10.5)

## 2017-08-23 LAB — GLUCOSE, CAPILLARY
GLUCOSE-CAPILLARY: 101 mg/dL — AB (ref 65–99)
GLUCOSE-CAPILLARY: 99 mg/dL (ref 65–99)

## 2017-08-23 LAB — TYPE AND SCREEN
ABO/RH(D): O POS
ANTIBODY SCREEN: NEGATIVE

## 2017-08-23 LAB — POC OCCULT BLOOD, ED: Fecal Occult Bld: POSITIVE — AB

## 2017-08-23 LAB — I-STAT TROPONIN, ED: Troponin i, poc: 0 ng/mL (ref 0.00–0.08)

## 2017-08-23 LAB — ABO/RH: ABO/RH(D): O POS

## 2017-08-23 LAB — LIPASE, BLOOD: Lipase: 19 U/L (ref 11–51)

## 2017-08-23 LAB — CBG MONITORING, ED: Glucose-Capillary: 105 mg/dL — ABNORMAL HIGH (ref 65–99)

## 2017-08-23 MED ORDER — SODIUM CHLORIDE 0.9 % IV BOLUS (SEPSIS)
500.0000 mL | Freq: Once | INTRAVENOUS | Status: AC
  Administered 2017-08-23: 500 mL via INTRAVENOUS

## 2017-08-23 MED ORDER — SODIUM CHLORIDE 0.9 % IV SOLN: 250.0000 mL | INTRAVENOUS | Status: DC | PRN

## 2017-08-23 MED ORDER — IOPAMIDOL (ISOVUE-300) INJECTION 61%
INTRAVENOUS | Status: AC
  Administered 2017-08-23: 100 mL
  Filled 2017-08-23: qty 100

## 2017-08-23 MED ORDER — PANTOPRAZOLE SODIUM 40 MG IV SOLR: 40.0000 mg | Freq: Two times a day (BID) | INTRAVENOUS | Status: DC

## 2017-08-23 MED ORDER — INSULIN ASPART 100 UNIT/ML ~~LOC~~ SOLN: 0.0000 [IU] | Freq: Three times a day (TID) | SUBCUTANEOUS | Status: DC

## 2017-08-23 MED ORDER — POTASSIUM CHLORIDE IN NACL 20-0.9 MEQ/L-% IV SOLN
INTRAVENOUS | Status: DC
  Administered 2017-08-23 – 2017-08-25 (×4): via INTRAVENOUS
  Filled 2017-08-23 (×5): qty 1000

## 2017-08-23 MED ORDER — SODIUM CHLORIDE 0.9% FLUSH: 3.0000 mL | Freq: Two times a day (BID) | INTRAVENOUS | Status: DC

## 2017-08-23 MED ORDER — SODIUM CHLORIDE 0.9% FLUSH: 3.0000 mL | INTRAVENOUS | Status: DC | PRN

## 2017-08-23 MED ORDER — ONDANSETRON HCL 4 MG PO TABS: 4.0000 mg | ORAL_TABLET | Freq: Four times a day (QID) | ORAL | Status: DC | PRN

## 2017-08-23 MED ORDER — SODIUM CHLORIDE 0.9 % IV SOLN
8.0000 mg/h | INTRAVENOUS | Status: DC
  Administered 2017-08-23 (×2): 8 mg/h via INTRAVENOUS
  Filled 2017-08-23 (×3): qty 80

## 2017-08-23 MED ORDER — ONDANSETRON HCL 4 MG/2ML IJ SOLN
4.0000 mg | Freq: Four times a day (QID) | INTRAMUSCULAR | Status: DC | PRN
  Administered 2017-08-23: 4 mg via INTRAVENOUS
  Filled 2017-08-23: qty 2

## 2017-08-23 MED ORDER — PANTOPRAZOLE SODIUM 40 MG IV SOLR
40.0000 mg | Freq: Once | INTRAVENOUS | Status: AC
  Administered 2017-08-23: 40 mg via INTRAVENOUS
  Filled 2017-08-23: qty 40

## 2017-08-23 MED ORDER — MORPHINE SULFATE (PF) 4 MG/ML IV SOLN: 1.0000 mg | INTRAVENOUS | Status: DC | PRN

## 2017-08-23 NOTE — Consult Note (Signed)
Reason for Consult:abdominal pain Referring Physician: Dr. Eulis Manly Kevin Patton is an 62 y.o. male.  HPI: Patient is a 62 year old male with a history of hypercholesterolemia, headaches, who comes in with a 2-day history of abdominal pain, nausea, vomiting, diarrhea.  Patient also appears to have some melena as well as coffee-ground emesis.  Patient states that he was recently out of town and had some massive emesis.  He states that since that time it is been coffee-ground in nature.  Patient also had accompanying diarrhea.  He states that he has had some black stools since the onset of the nausea and vomiting.  Patient states he most recently had a colonoscopy approximately 2 months ago by Dr. Earlean Shawl which only revealed a small polyp.  Patient did not have an upper endoscopy at that time.  Patient came into the ED secondary to continued abdominal pain he underwent CT scan which revealed a possible partial small bowel obstruction.  General surgery was consulted for further evaluation and management.  Patient's only past surgical history consists of a umbilical hernia repair  History reviewed. No pertinent past medical history.  History reviewed. No pertinent surgical history.  No family history on file.  Social History:  reports that  has never smoked. he has never used smokeless tobacco. He reports that he does not drink alcohol or use drugs.  Allergies: Not on File  Medications: I have reviewed the patient's current medications.  Results for orders placed or performed during the hospital encounter of 08/23/17 (from the past 48 hour(s))  Lipase, blood     Status: None   Collection Time: 08/23/17  9:24 AM  Result Value Ref Range   Lipase 19 11 - 51 U/L    Comment: Performed at [facility], [address], [place], [place] [address]  Comprehensive metabolic panel     Status: Abnormal   Collection Time: 08/23/17  9:24 AM  Result Value Ref Range   Sodium 137 135 - 145 mmol/L    Potassium 4.0 3.5 - 5.1 mmol/L   Chloride 103 101 - 111 mmol/L   CO2 22 22 - 32 mmol/L   Glucose, Bld 135 (H) 65 - 99 mg/dL   BUN 35 (H) 6 - 20 mg/dL   Creatinine, Ser 1.29 (H) 0.61 - 1.24 mg/dL   Calcium 8.5 (L) 8.9 - 10.3 mg/dL   Total Protein 7.5 6.5 - 8.1 g/dL   Albumin 3.9 3.5 - 5.0 g/dL   AST 33 15 - 41 U/L   ALT 30 17 - 63 U/L   Alkaline Phosphatase 58 38 - 126 U/L   Total Bilirubin 0.7 0.3 - 1.2 mg/dL   GFR calc non Af Amer 58 (L) >60 mL/min   GFR calc Af Amer >60 >60 mL/min    Comment: (NOTE) The eGFR has been calculated using the CKD EPI equation. This calculation has not been validated in all clinical situations. eGFR's persistently <60 mL/min signify possible Chronic Kidney Disease.    Anion gap 12 5 - 15    Comment: Performed at [facility] [address], [place] [address]  CBC     Status: Abnormal   Collection Time: 08/23/17  9:24 AM  Result Value Ref Range   WBC 8.5 4.0 - 10.5 K/uL   RBC 5.41 4.22 - 5.81 MIL/uL   Hemoglobin 17.3 (H) 13.0 - 17.0 g/dL   HCT 49.8 39.0 - 52.0 %   MCV 92.1 78.0 - 100.0 fL  MCH 32.0 26.0 - 34.0 pg   MCHC 34.7 30.0 - 36.0 g/dL   RDW 13.4 11.5 - 15.5 %   Platelets 185 150 - 400 K/uL    Comment: Performed at [facility], [place] [address] [place], [place] [address]  Type and screen [place]     Status: None   Collection Time: 08/23/17  9:30 AM  Result Value Ref Range   ABO/RH(D) O POS    Antibody Screen NEG    Sample Expiration      08/26/2017 Performed at [facility], [place] [address] [place], [place] [address]   ABO/Rh     Status: None   Collection Time: 08/23/17  9:30 AM  Result Value Ref Range   ABO/RH(D)      O POS Performed at [facility] [address], [place], [place] [address]   Urinalysis, Routine w reflex microscopic     Status: Abnormal   Collection Time: 08/23/17 11:08 AM  Result Value Ref Range   Color, Urine AMBER (A) YELLOW    Comment:  BIOCHEMICALS MAY BE AFFECTED BY COLOR   APPearance HAZY (A) CLEAR   Specific Gravity, Urine 1.031 (H) 1.005 - 1.030   pH 5.0 5.0 - 8.0   Glucose, UA NEGATIVE NEGATIVE mg/dL   Hgb urine dipstick NEGATIVE NEGATIVE   Bilirubin Urine NEGATIVE NEGATIVE   Ketones, ur NEGATIVE NEGATIVE mg/dL   Protein, ur 100 (A) NEGATIVE mg/dL   Nitrite NEGATIVE NEGATIVE   Leukocytes, UA NEGATIVE NEGATIVE   RBC / HPF 0-5 0 - 5 RBC/hpf   WBC, UA 0-5 0 - 5 WBC/hpf   Bacteria, UA NONE SEEN NONE SEEN   Squamous Epithelial / LPF NONE SEEN NONE SEEN   Mucus PRESENT    Hyaline Casts, UA PRESENT    Granular Casts, UA PRESENT     Comment: Performed at [facility], [place] [address] [place],  [address]  Brain natriuretic peptide     Status: None   Collection Time: 08/23/17 11:08 AM  Result Value Ref Range   B Natriuretic Peptide 22.3 0.0 - 100.0 pg/mL    Comment: Performed at [facility] [address], [place],  [address]  I-stat troponin, ED     Status: None   Collection Time: 08/23/17 11:32 AM  Result Value Ref Range   Troponin i, poc 0.00 0.00 - 0.08 ng/mL   Comment 3            Comment: Due to the release kinetics of cTnI, a negative result within the first hours of the onset of symptoms does not rule out myocardial infarction with certainty. If myocardial infarction is still suspected, repeat the test at appropriate intervals.   POC occult blood, ED     Status: Abnormal   Collection Time: 08/23/17 11:52 AM  Result Value Ref Range   Fecal Occult Bld POSITIVE (A) NEGATIVE    Ct Abdomen Pelvis W Contrast  Result Date: 08/23/2017 CLINICAL DATA:  Pt states vomiting black, "flakey" emesis and having black diarrhea since early morning. States feels extremely weak. EXAM: CT ABDOMEN AND PELVIS WITH CONTRAST TECHNIQUE: Multidetector CT imaging of the abdomen and pelvis was performed using the standard protocol following bolus administration of intravenous contrast. CONTRAST:  110m  ISOVUE-300 IOPAMIDOL (ISOVUE-300) INJECTION 61% COMPARISON:  None. FINDINGS: Lower chest: Small calcified granuloma in the right middle lobe. Small noncalcified, 4 mm, nodule in the lateral, subpleural right  lower lobe, image 7, series 4. Lung bases otherwise clear. Heart normal in size.a Hepatobiliary: 14 mm low-density lesion in the left liver lobe, segment 2, consistent with a cyst. No other liver liver masses or focal lesions. Gallbladder is distended with no wall thickening or adjacent inflammation. No bile duct dilation. Pancreas: Unremarkable. No pancreatic ductal dilatation or surrounding inflammatory changes. Spleen: Normal in size without focal abnormality. Adrenals/Urinary Tract: No adrenal masses. Single small nonobstructing stones in each kidney. 3.2 cm upper pole cyst on the right. 13 mm midpole cyst on the left. 3.1 cm exophytic lower pole cyst on the left. No other renal masses. No hydronephrosis. Ureters are normal in course and in caliber. Bladder is unremarkable. Stomach/Bowel: Stomach is moderately distended. No stomach wall thickening or adjacent inflammation. There is mild dilation of the proximal through the mid small bowel, maximum diameter of 4.2 cm. In the right upper quadrant there are 2 possible transition points where the small bowel narrows. Distal to this, the small bowel is mostly decompressed. There is no small bowel wall thickening or adjacent inflammation. The colon is nondilated. There are few small diverticula, but no evidence of diverticulitis or other colonic inflammatory process. Normal appendix is visualized. Vascular/Lymphatic: No significant vascular findings are present. No enlarged abdominal or pelvic lymph nodes. Reproductive: Unremarkable. Other: No abdominal wall hernia.  No ascites. Musculoskeletal: No fracture or acute finding. No osteoblastic or osteolytic lesions. IMPRESSION: 1. Findings are consistent with a grade partial small bowel obstruction, with an apparent  transition point in the right upper quadrant. There are 2 areas of narrowing of the ileum in the right upper quadrant, which may be the cause of the partial obstruction. These areas of apparent narrowing could be due to spasm as opposed to a stricture or adhesion. Follow-up small bowel follow-through study may be beneficial. 2. No other evidence of an acute abnormality within the abdomen or pelvis. 3. 4 mm noncalcified right lower lobe pulmonary nodule. If there is no evidence of malignancy in this patient, the following would be recommended. No follow-up needed if patient is low-risk. Non-contrast chest CT can be considered in 12 months if patient is high-risk. This recommendation follows the consensus statement: Guidelines for Management of Incidental Pulmonary Nodules Detected on CT Images: From the Fleischner Society 2017; Radiology 2017; 284:228-243. 4. Small liver cyst and bilateral renal cysts. Electronically Signed   By: Lajean Manes M.D.   On: 08/23/2017 11:59    Review of Systems  Constitutional: Negative for chills, fever and malaise/fatigue.  HENT: Negative for ear discharge, hearing loss and sore throat.   Eyes: Negative for blurred vision and discharge.  Respiratory: Negative for cough and shortness of breath.   Cardiovascular: Negative for chest pain, orthopnea and leg swelling.  Gastrointestinal: Positive for abdominal pain, diarrhea, nausea and vomiting. Negative for constipation and heartburn.  Musculoskeletal: Negative for myalgias and neck pain.  Skin: Negative for itching and rash.  Neurological: Negative for dizziness, focal weakness, seizures and loss of consciousness.  Endo/Heme/Allergies: Negative for environmental allergies. Does not bruise/bleed easily.  Psychiatric/Behavioral: Negative for depression and suicidal ideas.  All other systems reviewed and are negative.  Blood pressure 112/82, pulse 80, temperature 97.8 F (36.6 C), temperature source Oral, resp. rate 17,  SpO2 95 %. Physical Exam  Constitutional: He is oriented to person, place, and time. Vital signs are normal. He appears well-developed and well-nourished.  Conversant No acute distress  Eyes: Lids are normal. No scleral icterus.  No lid lag  Moist conjunctiva  Neck: No tracheal tenderness present. No thyromegaly present.  No cervical lymphadenopathy  Cardiovascular: Normal rate, regular rhythm and intact distal pulses.  No murmur heard. Respiratory: Effort normal and breath sounds normal. He has no wheezes. He has no rales.  GI: Soft. He exhibits distension. Bowel sounds are increased. There is no hepatosplenomegaly. There is no tenderness. There is no rebound and no guarding. No hernia.  Neurological: He is alert and oriented to person, place, and time.  Normal gait and station  Skin: Skin is warm. No rash noted. No cyanosis. Nails show no clubbing.  Normal skin turgor  Psychiatric: Judgment normal.  Appropriate affect    Assessment/Plan: 62 year old male with partial small bowel obstruction versus possible ileus secondary to gastroenteritis. HLD Headaches   1.  At this time we recommend continued IV fluid rehydration. 2.  At this time I do not feel that the patient has a bowel obstruction-we will continue to observe with serial abdominal exams.  Rosario Jacks., Ranata Laughery 08/23/2017, 1:52 PM

## 2017-08-23 NOTE — H&P (Signed)
History and Physical    Ronaldo MiyamotoJoseph F Cullop ZOX:[id number] DOB: December 31, 1955 DOA: 08/23/2017  PCP: Karleen HampshireMcGough, William, MD  Patient coming from: Home  I have personally briefly reviewed patient's old medical [facility]  Chief Complaint: Coffee-ground emesis and abdominal pain as well as times 2 days  HPI: Ronaldo MiyamotoJoseph F Barret is a 62 y.o. male with medical history significant of  internal hemorrhoids, elevated cholesterol and chronic migraines came into the emergency department complaining of emesis with black flecks in it which began 2 days ago.  Associated with severe abdominal pain he had an emesis of black diarrhea as well.  He states that Friday night he started to feel bad while he was at the beach.  Bed the entire ride back from the beach.  Developed vomiting and started vomiting every 2 hours until he decided to come to the emergency department for further evaluation.  He states that he has been having black diarrhea as well.  He has no abdominal pain complains of bloating and distention.  He states he would feel better after he vomited and then it would get distended and feel bad again.  Had no bright red blood either per rectum or per oral.  He has been feeling weak and not well. ED Course: He was rapidly evaluated and underwent CT scanning which showed partial small bowel obstruction.  His hemoglobin was stable at 17.3 he was referred to internal medicine for further evaluation and management he was seen in consultation in the emergency department by general surgery as well as gastroenterology.  Review of Systems: As per HPI otherwise complete review of systems negative.    History reviewed. No pertinent past medical history.  Hyperlipidemia, chronic migraine headaches, an episode of chest pain years ago underwent evaluation was found not to have any coronary disease by stress testing.  History reviewed. No pertinent surgical history.  Patient had an umbilical hernia repair in  2012   reports that  has never smoked. he has never used smokeless tobacco. He reports that he does not drink alcohol or use drugs.  No Known Allergies  No family history on file. Family history is negative for colon cancer.  Prior to Admission medications   Medication Sig Start Date End Date Taking? Authorizing Provider  acetaminophen (TYLENOL) 650 MG CR tablet Take 1,300 mg by mouth once.   Yes [provider]  aspirin 325 MG tablet Take 162.5 mg by mouth at bedtime.   Yes [provider]  Multiple Vitamin (MULTIVITAMIN WITH MINERALS) TABS tablet Take 1 tablet by mouth at bedtime. Men's Multi Vitamin   Yes [provider]  Omega-3 Fatty Acids (FISH OIL PO) Take 1 capsule by mouth at bedtime.   Yes [provider]  PRESCRIPTION MEDICATION Inhale into the lungs at bedtime. BIPAP   Yes [provider]  Psyllium (METAMUCIL PO) Take 10 mLs by mouth daily. Mix in 8 oz water and drink   Yes [provider]  simvastatin (ZOCOR) 20 MG tablet Take 20 mg by mouth at bedtime. 05/18/17  Yes [provider]  zonisamide (ZONEGRAN) 50 MG capsule Take 150 mg by mouth at bedtime. 08/03/17  Yes [provider]    Physical Exam: Vitals:   08/23/17 1415 08/23/17 1500 08/23/17 1545 08/23/17 1709  BP: 116/87 126/90 124/90 120/81  Pulse: 87 83 80 80  Resp: (!) 23 (!) 22 (!) 23   Temp:    98.9 F (37.2 C)  TempSrc:  Oral  SpO2: 95% 94% 96% 95%  Weight:    111.9 kg (246 lb 11.1 oz)  Height:    6\' 2"  (1.88 m)   .TCS Constitutional: NAD, calm, comfortable Vitals:   08/23/17 1415 08/23/17 1500 08/23/17 1545 08/23/17 1709  BP: 116/87 126/90 124/90 120/81  Pulse: 87 83 80 80  Resp: (!) 23 (!) 22 (!) 23   Temp:    98.9 F (37.2 C)  TempSrc:    Oral  SpO2: 95% 94% 96% 95%  Weight:    111.9 kg (246 lb 11.1 oz)  Height:    6\' 2"  (1.88 m)   Eyes: PERRL, lids and conjunctivae normal ENMT: Mucous membranes are moist. Posterior  pharynx clear of any exudate or lesions.Normal dentition.  Neck: normal, supple, no masses, no thyromegaly Respiratory: clear to auscultation bilaterally, no wheezing, no crackles. Normal respiratory effort. No accessory muscle use.  Cardiovascular: Regular rate and rhythm, no murmurs / rubs / gallops. No extremity edema. 2+ pedal pulses. No carotid bruits.  Abdomen: Minimal tenderness, no masses palpated. No hepatosplenomegaly. Bowel sounds proactive but positive.  Distended Musculoskeletal: no clubbing / cyanosis. No joint deformity upper and lower extremities. Good ROM, no contractures. Normal muscle tone.  Skin: no rashes, lesions, ulcers. No induration Neurologic: CN 2-12 grossly intact. Sensation intact, DTR normal. Strength 5/5 in all 4.  Psychiatric: Normal judgment and insight. Alert and oriented x 3. Normal mood.    Labs on Admission: I have personally reviewed following labs and imaging studies  CBC: Recent Labs  Lab 08/23/17 0924  WBC 8.5  HGB 17.3*  HCT 49.8  MCV 92.1  PLT 185   Basic Metabolic Panel: Recent Labs  Lab 08/23/17 0924  NA 137  K 4.0  CL 103  CO2 22  GLUCOSE 135*  BUN 35*  CREATININE 1.29*  CALCIUM 8.5*   GFR: Estimated Creatinine Clearance: 80 mL/min (A) (by C-G formula based on SCr of 1.29 mg/dL (H)). Liver Function Tests: Recent Labs  Lab 08/23/17 0924  AST 33  ALT 30  ALKPHOS 58  BILITOT 0.7  PROT 7.5  ALBUMIN 3.9   Recent Labs  Lab 08/23/17 0924  LIPASE 19   No results for input(s): AMMONIA in the last 168 hours. Coagulation Profile: No results for input(s): INR, PROTIME in the last 168 hours. Cardiac Enzymes: No results for input(s): CKTOTAL, CKMB, CKMBINDEX, TROPONINI in the last 168 hours. BNP (last 3 results) No results for input(s): PROBNP in the last 8760 hours. HbA1C: No results for input(s): HGBA1C in the last 72 hours. CBG: Recent Labs  Lab 08/23/17 1520 08/23/17 1737  GLUCAP 105* 101*   Lipid Profile: No  results for input(s): CHOL, HDL, LDLCALC, TRIG, CHOLHDL, LDLDIRECT in the last 72 hours. Thyroid Function Tests: No results for input(s): TSH, T4TOTAL, FREET4, T3FREE, THYROIDAB in the last 72 hours. Anemia Panel: No results for input(s): VITAMINB12, FOLATE, FERRITIN, TIBC, IRON, RETICCTPCT in the last 72 hours. Urine analysis:    Component Value Date/Time   COLORURINE AMBER (A) 08/23/2017 1108   APPEARANCEUR HAZY (A) 08/23/2017 1108   LABSPEC 1.031 (H) 08/23/2017 1108   PHURINE 5.0 08/23/2017 1108   GLUCOSEU NEGATIVE 08/23/2017 1108   HGBUR NEGATIVE 08/23/2017 1108   BILIRUBINUR NEGATIVE 08/23/2017 1108   KETONESUR NEGATIVE 08/23/2017 1108   PROTEINUR 100 (A) 08/23/2017 1108   NITRITE NEGATIVE 08/23/2017 1108   LEUKOCYTESUR NEGATIVE 08/23/2017 1108    Radiological Exams on Admission: Ct Abdomen Pelvis W Contrast  Result Date:  08/23/2017 CLINICAL DATA:  Pt states vomiting black, "flakey" emesis and having black diarrhea since early morning. States feels extremely weak. EXAM: CT ABDOMEN AND PELVIS WITH CONTRAST TECHNIQUE: Multidetector CT imaging of the abdomen and pelvis was performed using the standard protocol following bolus administration of intravenous contrast. CONTRAST:  ISOVUE-300 IOPAMIDOL (ISOVUE-300) INJECTION 61% COMPARISON:  None. FINDINGS: Lower chest: Small calcified granuloma in the right middle lobe. Small noncalcified, 4 mm, nodule in the lateral, subpleural right lower lobe, image 7, series 4. Lung bases otherwise clear. Heart normal in size.a Hepatobiliary: 14 mm low-density lesion in the left liver lobe, segment 2, consistent with a cyst. No other liver liver masses or focal lesions. Gallbladder is distended with no wall thickening or adjacent inflammation. No bile duct dilation. Pancreas: Unremarkable. No pancreatic ductal dilatation or surrounding inflammatory changes. Spleen: Normal in size without focal abnormality. Adrenals/Urinary Tract: No adrenal masses.  Single small nonobstructing stones in each kidney. 3.2 cm upper pole cyst on the right. 13 mm midpole cyst on the left. 3.1 cm exophytic lower pole cyst on the left. No other renal masses. No hydronephrosis. Ureters are normal in course and in caliber. Bladder is unremarkable. Stomach/Bowel: Stomach is moderately distended. No stomach wall thickening or adjacent inflammation. There is mild dilation of the proximal through the mid small bowel, maximum diameter of 4.2 cm. In the right upper quadrant there are 2 possible transition points where the small bowel narrows. Distal to this, the small bowel is mostly decompressed. There is no small bowel wall thickening or adjacent inflammation. The colon is nondilated. There are few small diverticula, but no evidence of diverticulitis or other colonic inflammatory process. Normal appendix is visualized. Vascular/Lymphatic: No significant vascular findings are present. No enlarged abdominal or pelvic lymph nodes. Reproductive: Unremarkable. Other: No abdominal wall hernia.  No ascites. Musculoskeletal: No fracture or acute finding. No osteoblastic or osteolytic lesions. IMPRESSION: 1. Findings are consistent with a grade partial small bowel obstruction, with an apparent transition point in the right upper quadrant. There are 2 areas of narrowing of the ileum in the right upper quadrant, which may be the cause of the partial obstruction. These areas of apparent narrowing could be due to spasm as opposed to a stricture or adhesion. Follow-up small bowel follow-through study may be beneficial. 2. No other evidence of an acute abnormality within the abdomen or pelvis. 3. 4 mm noncalcified right lower lobe pulmonary nodule. If there is no evidence of malignancy in this patient, the following would be recommended. No follow-up needed if patient is low-risk. Non-contrast chest CT can be considered in 12 months if patient is high-risk. This recommendation follows the consensus  statement: Guidelines for Management of Incidental Pulmonary Nodules Detected on CT Images: From the Fleischner Society 2017; Radiology 2017; 284:228-243. 4. Small liver cyst and bilateral renal cysts. Electronically Signed   By: Amie Portland M.D.   On: 08/23/2017 11:59    EKG: Independently reviewed.  Personally viewed by me shows sinus rhythm with left atrial enlargement, incomplete right bundle branch block and left anterior fascicular block.  Assessment/Plan Principal Problem:   Partial small bowel obstruction (HCC) Active Problems:   Migraine syndrome   Hyperlipidemia 1.  Partial small bowel obstruction with resultant melena and coffee-ground emesis: Patient has responded well to antiemetics in the emergency department he has not had any further vomiting.  We will start IV fluid as well as continued antiemetics and proton pump inhibitor IV.  Appreciate expected consults from GI  and surgery.  Hopefully this will resolve soon and the patient will not require surgery.  2.  Migraine syndrome patient takes zonisamide every day this will have to be on hold for now.  3.  Hyperlipidemia: All medications on hold until patient able to tolerate p.o.'s better  DVT prophylaxis: SCDs Code Status: Full code Family Communication: Spoke with patient, his wife, and their pastor who was present at the time of evaluation. Disposition Plan: Home hopefully in 3 or 4 days Consults called: Surgery Dr. Derrell Lolling and GI Dr Lavon Paganini Admission status: Inpatient   Lahoma Crocker MD FACP [facility] Pager [phone]  If 7PM-7AM, please contact night-coverage [email or web address] Password [facility]  08/23/2017, 7:16 PM

## 2017-08-23 NOTE — Consult Note (Addendum)
Referring Provider:  Dr. Particia NearingHaviland, EDP Primary Care Physician:  Karleen HampshireMcGough, William, MD Primary Gastroenterologist:  Dr. Kinnie ScalesMedoff  Reason for Consultation:  N/V, black stools and black emesis  HPI: Kevin Patton is a 62 y.o. male with history of CAD, internal hemorrhoids, headaches who presents today for evaluation of vomiting of black material, black diarrhea, and abdominal distention.  He reports that Friday night he started to feel bad and was at the beach.  Saturday he had to drive back with his son and the drive was terrible but he did not start vomiting until last night.  Says that he vomited a lot with black flakes in it.  Says that every couple of hours this was occurring so he finally decided to be evaluated.  Says that he has been having black diarrhea as well.  No abdominal pain per se but abdomen would become diffusely bloated/distended, which improved for a short time with the vomiting episodes.  He denies frank blood in either his vomit or diarrhea.  He reports that since this started he has been feeling generally weak and not well.  He denies fevers or chills, no known sick contacts or contacts with similar symptoms.  Denies NSAID use.  Just takes 162.5 mg ASA daily.  CT scan of the abdomen and pelvis showed the following:  IMPRESSION: 1. Findings are consistent with a grade partial small bowel obstruction, with an apparent transition point in the right upper quadrant. There are 2 areas of narrowing of the ileum in the right upper quadrant, which may be the cause of the partial obstruction. These areas of apparent narrowing could be due to spasm as opposed to a stricture or adhesion. Follow-up small bowel follow-through study may be beneficial. 2. No other evidence of an acute abnormality within the abdomen or pelvis. 3. 4 mm noncalcified right lower lobe pulmonary nodule. If there is no evidence of malignancy in this patient, the following would be recommended. No follow-up needed if  patient is low-risk. Non-contrast chest CT can be considered in 12 months if patient is high-risk. This recommendation follows the consensus statement:  Guidelines for Management of Incidental Pulmonary Nodules Detected on CT Images: From the Fleischner Society 2017; Radiology 2017; 284:228-243. 4. Small liver cyst and bilateral renal cysts.  Hgb is 17 grams.  BUN and Cr both slightly elevated.  EGD 10/2010 by Dr. Karilyn Cotaehman showed multiple antral erosions.  Esophageal biopsies showed mild inflammation c/w reflux.  Says that he had a colonoscopy with Dr. Kinnie ScalesMedoff just on 06/17/17 with one tubular adenoma removed.  History reviewed. No pertinent past medical history.  History reviewed. No pertinent surgical history.  Prior to Admission medications   Medication Sig Start Date End Date Taking? Authorizing Provider  topiramate (TOPAMAX) 50 MG tablet Take 2 tablets (100 mg total) by mouth 2 (two) times daily. 11/16/12   Levert FeinsteinYan, Yijun, MD    No current facility-administered medications for this encounter.    Current Outpatient Medications  Medication Sig Dispense Refill  . topiramate (TOPAMAX) 50 MG tablet Take 2 tablets (100 mg total) by mouth 2 (two) times daily. 120 tablet 3    Allergies as of 08/23/2017  . (Not on File)    No family history on file.  Social History   Socioeconomic History  . Marital status: Married    Spouse name: Not on file  . Number of children: Not on file  . Years of education: Not on file  . Highest education level: Not on  file  Social Needs  . Financial resource strain: Not on file  . Food insecurity - worry: Not on file  . Food insecurity - inability: Not on file  . Transportation needs - medical: Not on file  . Transportation needs - non-medical: Not on file  Occupational History  . Not on file  Tobacco Use  . Smoking status: Never Smoker  . Smokeless tobacco: Never Used  Substance and Sexual Activity  . Alcohol use: No    Frequency: Never  . Drug  use: No  . Sexual activity: Not on file  Other Topics Concern  . Not on file  Social History Narrative  . Not on file    Review of Systems: ROS is O/W negative except as mentioned in HPI.  Physical Exam: Vital signs in last 24 hours: Temp:  [97.8 F (36.6 C)] 97.8 F (36.6 C) (02/24 0922) Pulse Rate:  [84-106] 84 (02/24 1045) Resp:  [18] 18 (02/24 0922) BP: (113-121)/(83-91) 117/83 (02/24 1045) SpO2:  [96 %-97 %] 96 % (02/24 1045)   General:  Alert, Well-developed, well-nourished, pleasant and cooperative in NAD Head:  Normocephalic and atraumatic. Eyes:  Sclera clear, no icterus.   Conjunctiva pink. Ears:  Normal auditory acuity. Mouth:  No deformity or lesions.   Lungs:  Clear throughout to auscultation.   No wheezes, crackles, or rhonchi.  Heart:  Regular rate and rhythm; no murmurs, clicks, rubs, or gallops. Abdomen:  Soft, non-distended.  BS present, hyperactive.  Somewhat tympanic.  Non-tender.   Rectal:  Deferred  Msk:  Symmetrical without gross deformities. Pulses:  Normal pulses noted. Extremities:  Without clubbing or edema. Neurologic:  Alert and oriented x 4;  grossly normal neurologically. Skin:  Intact without significant lesions or rashes. Psych:  Alert and cooperative. Normal mood and affect.  Lab Results: Recent Labs    08/23/17 0924  WBC 8.5  HGB 17.3*  HCT 49.8  PLT 185   BMET Recent Labs    08/23/17 0924  NA 137  K 4.0  CL 103  CO2 22  GLUCOSE 135*  BUN 35*  CREATININE 1.29*  CALCIUM 8.5*   LFT Recent Labs    08/23/17 0924  PROT 7.5  ALBUMIN 3.9  AST 33  ALT 30  ALKPHOS 58  BILITOT 0.7   Studies/Results: Ct Abdomen Pelvis W Contrast  Result Date: 08/23/2017 CLINICAL DATA:  Pt states vomiting black, "flakey" emesis and having black diarrhea since early morning. States feels extremely weak. EXAM: CT ABDOMEN AND PELVIS WITH CONTRAST TECHNIQUE: Multidetector CT imaging of the abdomen and pelvis was performed using the standard  protocol following bolus administration of intravenous contrast. CONTRAST:  ISOVUE-300 IOPAMIDOL (ISOVUE-300) INJECTION 61% COMPARISON:  None. FINDINGS: Lower chest: Small calcified granuloma in the right middle lobe. Small noncalcified, 4 mm, nodule in the lateral, subpleural right lower lobe, image 7, series 4. Lung bases otherwise clear. Heart normal in size.a Hepatobiliary: 14 mm low-density lesion in the left liver lobe, segment 2, consistent with a cyst. No other liver liver masses or focal lesions. Gallbladder is distended with no wall thickening or adjacent inflammation. No bile duct dilation. Pancreas: Unremarkable. No pancreatic ductal dilatation or surrounding inflammatory changes. Spleen: Normal in size without focal abnormality. Adrenals/Urinary Tract: No adrenal masses. Single small nonobstructing stones in each kidney. 3.2 cm upper pole cyst on the right. 13 mm midpole cyst on the left. 3.1 cm exophytic lower pole cyst on the left. No other renal masses. No hydronephrosis. Ureters are  normal in course and in caliber. Bladder is unremarkable. Stomach/Bowel: Stomach is moderately distended. No stomach wall thickening or adjacent inflammation. There is mild dilation of the proximal through the mid small bowel, maximum diameter of 4.2 cm. In the right upper quadrant there are 2 possible transition points where the small bowel narrows. Distal to this, the small bowel is mostly decompressed. There is no small bowel wall thickening or adjacent inflammation. The colon is nondilated. There are few small diverticula, but no evidence of diverticulitis or other colonic inflammatory process. Normal appendix is visualized. Vascular/Lymphatic: No significant vascular findings are present. No enlarged abdominal or pelvic lymph nodes. Reproductive: Unremarkable. Other: No abdominal wall hernia.  No ascites. Musculoskeletal: No fracture or acute finding. No osteoblastic or osteolytic lesions. IMPRESSION: 1.  Findings are consistent with a grade partial small bowel obstruction, with an apparent transition point in the right upper quadrant. There are 2 areas of narrowing of the ileum in the right upper quadrant, which may be the cause of the partial obstruction. These areas of apparent narrowing could be due to spasm as opposed to a stricture or adhesion. Follow-up small bowel follow-through study may be beneficial. 2. No other evidence of an acute abnormality within the abdomen or pelvis. 3. 4 mm noncalcified right lower lobe pulmonary nodule. If there is no evidence of malignancy in this patient, the following would be recommended. No follow-up needed if patient is low-risk. Non-contrast chest CT can be considered in 12 months if patient is high-risk. This recommendation follows the consensus statement: Guidelines for Management of Incidental Pulmonary Nodules Detected on CT Images: From the Fleischner Society 2017; Radiology 2017; 284:228-243. 4. Small liver cyst and bilateral renal cysts. Electronically Signed   By: Amie Portland M.D.   On: 08/23/2017 11:59   IMPRESSION:  *62 year old male with nausea, vomiting, diarrhea, abdominal distention.  CT scans suggests PSBO, but suspect that he likely had a gastroenteritis with secondary ileus.  No vomiting in 10 hours but still some nausea.  Reports vomiting black material and black stools and was hemoccult positive.  Hgb actually high, likely due to dehydration/hemoconcentration. *Dehydration has evidenced by increased hgb and BUN/Cr.  PLAN: *Will treat conservatively with antiemetics, IVF's.  NGT if he has recurrent vomiting. *Will need eventually EGD this week before discharge once this ileus has resolved. *Monitor Hgb. *? SBFT later this week as well.   Princella Pellegrini. Zehr  08/23/2017, 1:13 PM  Pager number [phone]    Attending physician's note   I have taken an interval history, reviewed the chart and examined the patient. I agree with the Advanced  Practitioner's note, impression and recommendations.  60 yr M with h/o CAD on aspirin presented to ER with acute nausea, vomiting and diarrhea concerning for acute gastroenteritis. Hgb is elevated likely secondary to hemoconcentration. Hemodynamically stable. IV fluids and supportive care Will reevaluate tomorrow and decide the timing of EGD If continue to have persistent symptoms may need further imaging to evaluate ?partial small bowel obstruction in mid small bowel  K Scherry Ran, MD [phone] Mon-Fri 8a-5p [phone] after 5p, weekends, holidays

## 2017-08-23 NOTE — ED Notes (Signed)
Admitting at bedside 

## 2017-08-23 NOTE — ED Notes (Signed)
Pt reports he has had dark diarrhea and blood in vomit

## 2017-08-23 NOTE — ED Provider Notes (Signed)
[facility] EMERGENCY DEPARTMENT Provider Note   CSN: [id number] Arrival date & time: 08/23/17  4098     History   Chief Complaint Chief Complaint  Patient presents with  . Abdominal Pain    HPI Kevin Patton is a 62 y.o. male with history of CAD, internal hemorrhoids, who presents today for evaluation of 2 days of diffuse abdominal pain.  He reports that last night he started having emesis and nausea with black flakes in his emesis.  He also reports that he is having liquid, watery, diarrhea with black flakes in it.  He denies frank blood in either his vomit or diarrhea.  He reports that since this started he has been feeling generally weak and not well.  He denies fevers or chills, no known sick contacts or contacts with similar symptoms.    HPI  History reviewed. No pertinent past medical history.  Patient Active Problem List   Diagnosis Date Noted  . PALPITATIONS 12/11/2008  . CHEST PAIN-UNSPECIFIED 12/11/2008  . DYSLIPIDEMIA 11/15/2007  . CORONARY ARTERY DISEASE 11/15/2007  . SLEEP APNEA 11/15/2007  . HEADACHE, CHRONIC 11/15/2007    History reviewed. No pertinent surgical history.     Home Medications    Prior to Admission medications   Medication Sig Start Date End Date Taking? Authorizing Provider  topiramate (TOPAMAX) 50 MG tablet Take 2 tablets (100 mg total) by mouth 2 (two) times daily. 11/16/12   Levert Feinstein, MD    Family History No family history on file.  Social History Social History   Tobacco Use  . Smoking status: Never Smoker  . Smokeless tobacco: Never Used  Substance Use Topics  . Alcohol use: No    Frequency: Never  . Drug use: No     Allergies   Patient has no allergy information on record.   Review of Systems Review of Systems  Constitutional: Positive for fatigue. Negative for chills and fever.  Respiratory: Negative for chest tightness and shortness of breath.   Gastrointestinal: Positive for abdominal  distention, abdominal pain, diarrhea, nausea and vomiting. Negative for constipation.       Black flakes in diarrhea and vomit.   Genitourinary: Negative for dysuria.  Skin: Negative for pallor.  Neurological: Negative for light-headedness and headaches.  Psychiatric/Behavioral: Negative for confusion.  All other systems reviewed and are negative.    Physical Exam Updated Vital Signs BP 117/83   Pulse 84   Temp 97.8 F (36.6 C) (Oral)   Resp 18   SpO2 96%   Physical Exam  Constitutional: He appears well-developed and well-nourished.  Non-toxic appearance. He does not appear ill. No distress.  HENT:  Head: Normocephalic and atraumatic.  Eyes: Conjunctivae are normal.  Neck: Neck supple.  Cardiovascular: Normal rate and regular rhythm.  No murmur heard. Pulmonary/Chest: Effort normal and breath sounds normal. No respiratory distress.  Abdominal: Soft. He exhibits distension. Bowel sounds are decreased. There is generalized tenderness. There is no rigidity, no rebound and no guarding.  Genitourinary: Rectal exam shows guaiac positive stool.  Musculoskeletal: He exhibits no edema.  Neurological: He is alert.  Skin: Skin is warm and dry.  Psychiatric: He has a normal mood and affect.  Nursing note and vitals reviewed.    ED Treatments / Results  Labs (all labs ordered are listed, but only abnormal results are displayed) Labs Reviewed  COMPREHENSIVE METABOLIC PANEL - Abnormal; Notable for the following components:      Result Value   Glucose, Bld  135 (*)    BUN 35 (*)    Creatinine, Ser 1.29 (*)    Calcium 8.5 (*)    GFR calc non Af Amer 58 (*)    All other components within normal limits  CBC - Abnormal; Notable for the following components:   Hemoglobin 17.3 (*)    All other components within normal limits  URINALYSIS, ROUTINE W REFLEX MICROSCOPIC - Abnormal; Notable for the following components:   Color, Urine AMBER (*)    APPearance HAZY (*)    Specific Gravity,  Urine 1.031 (*)    Protein, ur 100 (*)    All other components within normal limits  POC OCCULT BLOOD, ED - Abnormal; Notable for the following components:   Fecal Occult Bld POSITIVE (*)    All other components within normal limits  CBG MONITORING, ED - Abnormal; Notable for the following components:   Glucose-Capillary 105 (*)    All other components within normal limits  LIPASE, BLOOD  BRAIN NATRIURETIC PEPTIDE  HIV ANTIBODY (ROUTINE TESTING)  HEMOGLOBIN A1C  POC OCCULT BLOOD, ED  I-STAT TROPONIN, ED  TYPE AND SCREEN  ABO/RH    EKG  EKG Interpretation None       Radiology Ct Abdomen Pelvis W Contrast  Result Date: 08/23/2017 CLINICAL DATA:  Pt states vomiting black, "flakey" emesis and having black diarrhea since early morning. States feels extremely weak. EXAM: CT ABDOMEN AND PELVIS WITH CONTRAST TECHNIQUE: Multidetector CT imaging of the abdomen and pelvis was performed using the standard protocol following bolus administration of intravenous contrast. CONTRAST:  ISOVUE-300 IOPAMIDOL (ISOVUE-300) INJECTION 61% COMPARISON:  None. FINDINGS: Lower chest: Small calcified granuloma in the right middle lobe. Small noncalcified, 4 mm, nodule in the lateral, subpleural right lower lobe, image 7, series 4. Lung bases otherwise clear. Heart normal in size.a Hepatobiliary: 14 mm low-density lesion in the left liver lobe, segment 2, consistent with a cyst. No other liver liver masses or focal lesions. Gallbladder is distended with no wall thickening or adjacent inflammation. No bile duct dilation. Pancreas: Unremarkable. No pancreatic ductal dilatation or surrounding inflammatory changes. Spleen: Normal in size without focal abnormality. Adrenals/Urinary Tract: No adrenal masses. Single small nonobstructing stones in each kidney. 3.2 cm upper pole cyst on the right. 13 mm midpole cyst on the left. 3.1 cm exophytic lower pole cyst on the left. No other renal masses. No hydronephrosis.  Ureters are normal in course and in caliber. Bladder is unremarkable. Stomach/Bowel: Stomach is moderately distended. No stomach wall thickening or adjacent inflammation. There is mild dilation of the proximal through the mid small bowel, maximum diameter of 4.2 cm. In the right upper quadrant there are 2 possible transition points where the small bowel narrows. Distal to this, the small bowel is mostly decompressed. There is no small bowel wall thickening or adjacent inflammation. The colon is nondilated. There are few small diverticula, but no evidence of diverticulitis or other colonic inflammatory process. Normal appendix is visualized. Vascular/Lymphatic: No significant vascular findings are present. No enlarged abdominal or pelvic lymph nodes. Reproductive: Unremarkable. Other: No abdominal wall hernia.  No ascites. Musculoskeletal: No fracture or acute finding. No osteoblastic or osteolytic lesions. IMPRESSION: 1. Findings are consistent with a grade partial small bowel obstruction, with an apparent transition point in the right upper quadrant. There are 2 areas of narrowing of the ileum in the right upper quadrant, which may be the cause of the partial obstruction. These areas of apparent narrowing could be due to spasm  as opposed to a stricture or adhesion. Follow-up small bowel follow-through study may be beneficial. 2. No other evidence of an acute abnormality within the abdomen or pelvis. 3. 4 mm noncalcified right lower lobe pulmonary nodule. If there is no evidence of malignancy in this patient, the following would be recommended. No follow-up needed if patient is low-risk. Non-contrast chest CT can be considered in 12 months if patient is high-risk. This recommendation follows the consensus statement: Guidelines for Management of Incidental Pulmonary Nodules Detected on CT Images: From the Fleischner Society 2017; Radiology 2017; 284:228-243. 4. Small liver cyst and bilateral renal cysts.  Electronically Signed   By: Amie Portlandavid  Ormond M.D.   On: 08/23/2017 11:59    Procedures Procedures (including critical care time)  Medications Ordered in ED Medications  sodium chloride 0.9 % bolus 500 mL (500 mLs Intravenous New Bag/Given 08/23/17 1055)  iopamidol (ISOVUE-300) 61 % injection (100 mLs  Contrast Given 08/23/17 1120)     Initial Impression / Assessment and Plan / ED Course  I have reviewed the triage vital signs and the nursing notes.  Pertinent labs & imaging results that were available during my care of the patient were reviewed by me and considered in my medical decision making (see chart for details).  Clinical Course as of Aug 23 1638  Wynelle LinkSun Aug 23, 2017  1254 Spoke with Dr. Derrell Lollingamirez from general surgery who reports that GI will be needed for evaluation of the melana, and will come see patient.   [EH]  1304 Spoke with Dr. Lavon PaganiniNandigam from [facility] who will come see patient.   [EH]  1354 Spoke with hospitalist who will admit.   [EH]    Clinical Course User Index [EH] Cristina GongHammond, Jahziel Sinn W, PA-C   Fonnie MuJoseph F Barbato Zentz today for evaluation of nausea, vomiting with black flecks in his vomit, diarrhea with dark flecks, and diffuse abdominal pain for 2 days.  He was not anemic with hemoglobin 17.3, Cr is slightly elevated at 1.29 with BUN 35, unsure what his baseline is but suspect this is secondary to dehydration.  CT abdomen pelvis was performed showing an incomplete small bowel obstruction at two points in RUQ.  General surgery was called who advised to hold off on NG tube, and that he would come see patient.  GI was consulted for his melana and vomiting coffee ground emesis who agreed to come see patient.   Patient was given fluid bolus, offered pain and nausea medicine but declined.  Hospitalist was consulted for admission.  Patient did not meet SIRS or sepsis criteria.  Not concerned for acute perforation.  Patient remained hemodynamically stable.    Final Clinical  Impressions(s) / ED Diagnoses   Final diagnoses:  Partial small bowel obstruction [facility](HCC)    ED Discharge Orders    None       Norman ClayHammond, Arihaan Bellucci W, PA-C 08/23/17 1640    Jacalyn LefevreHaviland, Julie, MD 08/26/17 1620

## 2017-08-23 NOTE — ED Triage Notes (Signed)
Pt to ER for evaluation of 2 days vomiting black, "flakey" emesis and having black diarrhea. States feels extremely weak. States takes a 160 mg aspirin a day for CAD. VS - 121/91, HR 106 in triage. Pt a/o x4.

## 2017-08-23 NOTE — ED Notes (Signed)
Messaged pharmacy for medications.

## 2017-08-23 NOTE — ED Notes (Signed)
Attempted report 

## 2017-08-24 ENCOUNTER — Inpatient Hospital Stay (HOSPITAL_COMMUNITY): Payer: Federal, State, Local not specified - PPO

## 2017-08-24 DIAGNOSIS — K56600 Partial intestinal obstruction, unspecified as to cause: Secondary | ICD-10-CM | POA: Diagnosis not present

## 2017-08-24 DIAGNOSIS — R1084 Generalized abdominal pain: Secondary | ICD-10-CM | POA: Diagnosis not present

## 2017-08-24 DIAGNOSIS — A044 Other intestinal Escherichia coli infections: Principal | ICD-10-CM

## 2017-08-24 DIAGNOSIS — A0811 Acute gastroenteropathy due to Norwalk agent: Secondary | ICD-10-CM

## 2017-08-24 DIAGNOSIS — K56609 Unspecified intestinal obstruction, unspecified as to partial versus complete obstruction: Secondary | ICD-10-CM | POA: Diagnosis not present

## 2017-08-24 LAB — BASIC METABOLIC PANEL
ANION GAP: 7 (ref 5–15)
BUN: 24 mg/dL — ABNORMAL HIGH (ref 6–20)
CHLORIDE: 111 mmol/L (ref 101–111)
CO2: 22 mmol/L (ref 22–32)
CREATININE: 0.95 mg/dL (ref 0.61–1.24)
Calcium: 7.6 mg/dL — ABNORMAL LOW (ref 8.9–10.3)
GFR calc non Af Amer: >60 mL/min (ref >60)
Glucose, Bld: 88 mg/dL (ref 65–99)
Potassium: 3.7 mmol/L (ref 3.5–5.1)
Sodium: 140 mmol/L (ref 135–145)

## 2017-08-24 LAB — CBC
HCT: 42 % (ref 39.0–52.0)
HEMOGLOBIN: 14.3 g/dL (ref 13.0–17.0)
MCH: 31.4 pg (ref 26.0–34.0)
MCHC: 34 g/dL (ref 30.0–36.0)
MCV: 92.1 fL (ref 78.0–100.0)
PLATELETS: 156 10*3/uL (ref 150–400)
RBC: 4.56 MIL/uL (ref 4.22–5.81)
RDW: 13.3 % (ref 11.5–15.5)
WBC: 6.2 10*3/uL (ref 4.0–10.5)

## 2017-08-24 LAB — GASTROINTESTINAL PANEL BY PCR, STOOL (REPLACES STOOL CULTURE)
ADENOVIRUS F40/41: NOT DETECTED
ASTROVIRUS: NOT DETECTED
CAMPYLOBACTER SPECIES: NOT DETECTED
CRYPTOSPORIDIUM: NOT DETECTED
CYCLOSPORA CAYETANENSIS: NOT DETECTED
ENTEROPATHOGENIC E COLI (EPEC): NOT DETECTED
ENTEROTOXIGENIC E COLI (ETEC): DETECTED — AB
Entamoeba histolytica: NOT DETECTED
Enteroaggregative E coli (EAEC): NOT DETECTED
Giardia lamblia: NOT DETECTED
Norovirus GI/GII: DETECTED — AB
PLESIMONAS SHIGELLOIDES: NOT DETECTED
ROTAVIRUS A: NOT DETECTED
SAPOVIRUS (I, II, IV, AND V): NOT DETECTED
SHIGA LIKE TOXIN PRODUCING E COLI (STEC): NOT DETECTED
Salmonella species: NOT DETECTED
Shigella/Enteroinvasive E coli (EIEC): NOT DETECTED
VIBRIO SPECIES: NOT DETECTED
Vibrio cholerae: NOT DETECTED
Yersinia enterocolitica: NOT DETECTED

## 2017-08-24 LAB — APTT: APTT: 31 s (ref 24–36)

## 2017-08-24 LAB — PROTIME-INR
INR: 1.1
PROTHROMBIN TIME: 14.1 s (ref 11.4–15.2)

## 2017-08-24 LAB — GLUCOSE, CAPILLARY
GLUCOSE-CAPILLARY: 88 mg/dL (ref 65–99)
Glucose-Capillary: 86 mg/dL (ref 65–99)
Glucose-Capillary: 63 mg/dL — ABNORMAL LOW (ref 65–99)
Glucose-Capillary: 86 mg/dL (ref 65–99)

## 2017-08-24 LAB — HEMOGLOBIN A1C
Hgb A1c MFr Bld: 5.4 % (ref 4.8–5.6)
Mean Plasma Glucose: 108 mg/dL

## 2017-08-24 LAB — HIV ANTIBODY (ROUTINE TESTING W REFLEX): HIV Screen 4th Generation wRfx: NONREACTIVE

## 2017-08-24 MED ORDER — DEXTROSE 50 % IV SOLN
INTRAVENOUS | Status: AC
  Filled 2017-08-24: qty 50

## 2017-08-24 MED ORDER — PANTOPRAZOLE SODIUM 40 MG PO TBEC
40.0000 mg | DELAYED_RELEASE_TABLET | Freq: Every day | ORAL | Status: DC
  Administered 2017-08-24 – 2017-08-25 (×2): 40 mg via ORAL
  Filled 2017-08-24 (×2): qty 1

## 2017-08-24 NOTE — Progress Notes (Addendum)
Daily Rounding Note  08/24/2017, 1:09 PM  LOS: 1 day   SUBJECTIVE:   Chief complaint:   Nausea resolved, last emesis was Sat in early AM.  Stools improved, last was early this AM, still loose and brown.  No abd pain.      OBJECTIVE:         Vital signs in last 24 hours:    Temp:  [98.1 F (36.7 C)-98.9 F (37.2 C)] 98.1 F (36.7 C) (02/25 0602) Pulse Rate:  [65-87] 65 (02/25 0602) Resp:  [17-27] 17 (02/25 0602) BP: (112-126)/(76-90) 115/76 (02/25 0602) SpO2:  [93 %-99 %] 98 % (02/25 0602) Weight:  [111.9 kg (246 lb 11.1 oz)] 111.9 kg (246 lb 11.1 oz) (02/24 1709) Last BM Date: 08/24/17 Filed Weights   08/23/17 1709  Weight: 111.9 kg (246 lb 11.1 oz)   General: looks well.     Heart: RRR Chest: clear bil.   Abdomen: soft, NT, ND.  Active BS  Extremities: no CCE Neuro/Psych:  Some anxiety but cooperative.  Fully alert, no gross deficits.    Intake/Output from previous day: 02/24 0701 - 02/25 0700 In: 2130.4 [I.V.:2130.4] Out: -   Intake/Output this shift: No intake/output data recorded.  Lab Results: Recent Labs    08/23/17 0924 08/24/17 0611  WBC 8.5 6.2  HGB 17.3* 14.3  HCT 49.8 42.0  PLT 185 156   BMET Recent Labs    08/23/17 0924 08/24/17 0611  NA 137 140  K 4.0 3.7  CL 103 111  CO2 22 22  GLUCOSE 135* 88  BUN 35* 24*  CREATININE 1.29* 0.95  CALCIUM 8.5* 7.6*   LFT Recent Labs    08/23/17 0924  PROT 7.5  ALBUMIN 3.9  AST 33  ALT 30  ALKPHOS 58  BILITOT 0.7   PT/INR Recent Labs    08/24/17 0611  LABPROT 14.1  INR 1.10   Hepatitis Panel No results for input(s): HEPBSAG, HCVAB, HEPAIGM, HEPBIGM in the last 72 hours.  Studies/Results: Abd 1 View (kub)  Result Date: 08/24/2017 CLINICAL DATA:  History of umbilical hernia repair, follow-up partial small bowel obstruction. EXAM: ABDOMEN - 1 VIEW COMPARISON:  Abdominal and pelvic CT scan of August 23, 2017 FINDINGS: There  remain loops of mildly distended gas-filled small bowel in the mid abdomen. The degree of gaseous distention has decreased since yesterday's CT scan. There is a moderate amount of gas and stool in the colon and rectum. There is an approximately 2 mm diameter calcification projecting over the left kidney which corresponds to a probable stone on yesterday's CT scan. There are degenerative changes of the lower lumbar spine. IMPRESSION: Persistent but improving partial small bowel obstruction. Left-sided kidney stone. Electronically Signed   By: David  SwazilandJordan M.D.   On: 08/24/2017 08:24   Ct Abdomen Pelvis W Contrast  Result Date: 08/23/2017 CLINICAL DATA:  Pt states vomiting black, "flakey" emesis and having black diarrhea since early morning. States feels extremely weak. EXAM: CT ABDOMEN AND PELVIS WITH CONTRAST TECHNIQUE: Multidetector CT imaging of the abdomen and pelvis was performed using the standard protocol following bolus administration of intravenous contrast. CONTRAST:  100mL ISOVUE-300 IOPAMIDOL (ISOVUE-300) INJECTION 61% COMPARISON:  None. FINDINGS: Lower chest: Small calcified granuloma in the right middle lobe. Small noncalcified, 4 mm, nodule in the lateral, subpleural right lower lobe, image 7, series 4. Lung bases otherwise clear. Heart normal in size.a Hepatobiliary: 14 mm low-density lesion in the left liver  lobe, segment 2, consistent with a cyst. No other liver liver masses or focal lesions. Gallbladder is distended with no wall thickening or adjacent inflammation. No bile duct dilation. Pancreas: Unremarkable. No pancreatic ductal dilatation or surrounding inflammatory changes. Spleen: Normal in size without focal abnormality. Adrenals/Urinary Tract: No adrenal masses. Single small nonobstructing stones in each kidney. 3.2 cm upper pole cyst on the right. 13 mm midpole cyst on the left. 3.1 cm exophytic lower pole cyst on the left. No other renal masses. No hydronephrosis. Ureters are normal  in course and in caliber. Bladder is unremarkable. Stomach/Bowel: Stomach is moderately distended. No stomach wall thickening or adjacent inflammation. There is mild dilation of the proximal through the mid small bowel, maximum diameter of 4.2 cm. In the right upper quadrant there are 2 possible transition points where the small bowel narrows. Distal to this, the small bowel is mostly decompressed. There is no small bowel wall thickening or adjacent inflammation. The colon is nondilated. There are few small diverticula, but no evidence of diverticulitis or other colonic inflammatory process. Normal appendix is visualized. Vascular/Lymphatic: No significant vascular findings are present. No enlarged abdominal or pelvic lymph nodes. Reproductive: Unremarkable. Other: No abdominal wall hernia.  No ascites. Musculoskeletal: No fracture or acute finding. No osteoblastic or osteolytic lesions. IMPRESSION: 1. Findings are consistent with a grade partial small bowel obstruction, with an apparent transition point in the right upper quadrant. There are 2 areas of narrowing of the ileum in the right upper quadrant, which may be the cause of the partial obstruction. These areas of apparent narrowing could be due to spasm as opposed to a stricture or adhesion. Follow-up small bowel follow-through study may be beneficial. 2. No other evidence of an acute abnormality within the abdomen or pelvis. 3. 4 mm noncalcified right lower lobe pulmonary nodule. If there is no evidence of malignancy in this patient, the following would be recommended. No follow-up needed if patient is low-risk. Non-contrast chest CT can be considered in 12 months if patient is high-risk. This recommendation follows the consensus statement: Guidelines for Management of Incidental Pulmonary Nodules Detected on CT Images: From the Fleischner Society 2017; Radiology 2017; 284:228-243. 4. Small liver cyst and bilateral renal cysts. Electronically Signed   By:  Amie Portland M.D.   On: 08/23/2017 11:59    ASSESMENT:   *   NV, diarrhea, abdominal distention.  Black emesis, FOBT +.    PSBO with 2 areas of ileal narrowing on CT.  Improving but not yet resolved PSBO pattern on today's KUB.   Stool path panel + for Norovirus and enterotoxigenic E coli.  Suspect ileus is secondary to acute gastroenteritis.   05/2017 Colonoscopy, Dr Kinnie Scales: removed 1 TA.   EGD 2012.  Dr Karilyn Cota.  Multiple antral erosision.  Path: mild inflammation, c/w reflux     *  Azotemia, AKI: improved.    *  Polycythemia, resolved post IVF.     PLAN   *  Given acute viral GI illness and resolving sxs and xray along with lack of anemia, not sure EGD needs to be done, will defer decision to Dr Rhea Belton. Given resolving sxs, no abx needed for the E coli.    *  Allow clears.    *  Stopped PPI drip, starting Protonix 40 mg PO daily.      Jennye Moccasin  08/24/2017, 1:09 PM Pager: [phone]

## 2017-08-24 NOTE — Progress Notes (Signed)
[facility] Progress Note     Subjective: CC- diarrhea Patient states that he feels much better this morning. He has not had any n/v over night. Abdominal distension and pain resolved. He does continue to have loose/watery BMs; he reports 7 BMs over night.  Objective: Vital signs in last 24 hours: Temp:  [97.8 F (36.6 C)-98.9 F (37.2 C)] 98.1 F (36.7 C) (02/25 0602) Pulse Rate:  [65-106] 65 (02/25 0602) Resp:  [17-27] 17 (02/25 0602) BP: (112-126)/(76-91) 115/76 (02/25 0602) SpO2:  [93 %-99 %] 98 % (02/25 0602) Weight:  [246 lb 11.1 oz (111.9 kg)] 246 lb 11.1 oz (111.9 kg) (02/24 1709) Last BM Date: 08/23/17  Intake/Output from previous day: 02/24 0701 - 02/25 0700 In: 2130.4 [I.V.:2130.4] Out: -  Intake/Output this shift: No intake/output data recorded.  PE: Gen:  Alert, NAD, pleasant HEENT: EOM's intact, pupils equal and round Pulm:  effort normal Abd: Soft, NT/ND, +BS, no HSM, no hernia Ext:  Calves soft and nontender Psych: A&Ox3  Skin: no rashes noted, warm and dry  Lab Results:  Recent Labs    08/23/17 0924  WBC 8.5  HGB 17.3*  HCT 49.8  PLT 185   BMET Recent Labs    08/23/17 0924  NA 137  K 4.0  CL 103  CO2 22  GLUCOSE 135*  BUN 35*  CREATININE 1.29*  CALCIUM 8.5*   PT/INR No results for input(s): LABPROT, INR in the last 72 hours. CMP     Component Value Date/Time   NA 137 08/23/2017 0924   K 4.0 08/23/2017 0924   CL 103 08/23/2017 0924   CO2 22 08/23/2017 0924   GLUCOSE 135 (H) 08/23/2017 0924   BUN 35 (H) 08/23/2017 0924   CREATININE 1.29 (H) 08/23/2017 0924   CALCIUM 8.5 (L) 08/23/2017 0924   PROT 7.5 08/23/2017 0924   ALBUMIN 3.9 08/23/2017 0924   AST 33 08/23/2017 0924   ALT 30 08/23/2017 0924   ALKPHOS 58 08/23/2017 0924   BILITOT 0.7 08/23/2017 0924   GFRNONAA 58 (L) 08/23/2017 0924   GFRAA >60 08/23/2017 0924   Lipase     Component Value Date/Time   LIPASE 19 08/23/2017 0924        Studies/Results: Ct Abdomen Pelvis W Contrast  Result Date: 08/23/2017 CLINICAL DATA:  Pt states vomiting black, "flakey" emesis and having black diarrhea since early morning. States feels extremely weak. EXAM: CT ABDOMEN AND PELVIS WITH CONTRAST TECHNIQUE: Multidetector CT imaging of the abdomen and pelvis was performed using the standard protocol following bolus administration of intravenous contrast. CONTRAST:  100mL ISOVUE-300 IOPAMIDOL (ISOVUE-300) INJECTION 61% COMPARISON:  None. FINDINGS: Lower chest: Small calcified granuloma in the right middle lobe. Small noncalcified, 4 mm, nodule in the lateral, subpleural right lower lobe, image 7, series 4. Lung bases otherwise clear. Heart normal in size.a Hepatobiliary: 14 mm low-density lesion in the left liver lobe, segment 2, consistent with a cyst. No other liver liver masses or focal lesions. Gallbladder is distended with no wall thickening or adjacent inflammation. No bile duct dilation. Pancreas: Unremarkable. No pancreatic ductal dilatation or surrounding inflammatory changes. Spleen: Normal in size without focal abnormality. Adrenals/Urinary Tract: No adrenal masses. Single small nonobstructing stones in each kidney. 3.2 cm upper pole cyst on the right. 13 mm midpole cyst on the left. 3.1 cm exophytic lower pole cyst on the left. No other renal masses. No hydronephrosis. Ureters are normal in course and in caliber. Bladder is unremarkable. Stomach/Bowel: Stomach is  moderately distended. No stomach wall thickening or adjacent inflammation. There is mild dilation of the proximal through the mid small bowel, maximum diameter of 4.2 cm. In the right upper quadrant there are 2 possible transition points where the small bowel narrows. Distal to this, the small bowel is mostly decompressed. There is no small bowel wall thickening or adjacent inflammation. The colon is nondilated. There are few small diverticula, but no evidence of diverticulitis or  other colonic inflammatory process. Normal appendix is visualized. Vascular/Lymphatic: No significant vascular findings are present. No enlarged abdominal or pelvic lymph nodes. Reproductive: Unremarkable. Other: No abdominal wall hernia.  No ascites. Musculoskeletal: No fracture or acute finding. No osteoblastic or osteolytic lesions. IMPRESSION: 1. Findings are consistent with a grade partial small bowel obstruction, with an apparent transition point in the right upper quadrant. There are 2 areas of narrowing of the ileum in the right upper quadrant, which may be the cause of the partial obstruction. These areas of apparent narrowing could be due to spasm as opposed to a stricture or adhesion. Follow-up small bowel follow-through study may be beneficial. 2. No other evidence of an acute abnormality within the abdomen or pelvis. 3. 4 mm noncalcified right lower lobe pulmonary nodule. If there is no evidence of malignancy in this patient, the following would be recommended. No follow-up needed if patient is low-risk. Non-contrast chest CT can be considered in 12 months if patient is high-risk. This recommendation follows the consensus statement: Guidelines for Management of Incidental Pulmonary Nodules Detected on CT Images: From the Fleischner Society 2017; Radiology 2017; 284:228-243. 4. Small liver cyst and bilateral renal cysts. Electronically Signed   By: Amie Portland M.D.   On: 08/23/2017 11:59    Anti-infectives: Anti-infectives (From admission, onward)   None       Assessment/Plan pSBO vs Ileus - XR and labs pending. Abdominal pain and n/v resolved. More likely an ileus than an obstruction. Ok for clear liquids from surgical standpoint. Follow GI panel.  ID - none FEN - IVF, NPO VTE - SCDs Foley - none Follow up - TBD   LOS: 1 day    Franne Forts , [facility] 08/24/2017, 7:49 AM Pager: [phone] Consults: [phone] Mon-Fri 7:00 am-4:30 pm Sat-Sun 7:00  am-11:30 am

## 2017-08-24 NOTE — Progress Notes (Signed)
PROGRESS NOTE    Patient: Kevin Patton     PCP: Karleen HampshireMcGough, William, MD                    DOB: [birth year]            DOA: 08/23/2017 [id number]             DOS: 08/24/2017, 11:49 AM   LOS: 1 day   Date of Service: The patient was seen and examined on 08/24/2017  Subjective:  Patient was seen and examined this morning, stable reporting improved abdominal pain and cramping still present of.  He has been n.p.o., IV fluid running.  He reports of no coffee-ground or bloody emesis overnight.  Reported improved stooling, still tarry/black   ----------------------------------------------------------------------------------------------------------------------  Brief Narrative:  62 y.o. male with medical history significant of  internal hemorrhoids, elevated cholesterol and chronic migraines came into the emergency department complaining of emesis with black flecks in it which began 2 days ago.  Associated with severe abdominal pain he had an emesis of black diarrhea as well.  He states that Friday night he started to feel bad while he was at the beach.  Bed the entire ride back from the beach.  Developed vomiting and started vomiting every 2 hours until he decided to come to the emergency department for further evaluation.  He states that he has been having black diarrhea as well.  He has no abdominal pain complains of bloating and distention.  He states he would feel better after he vomited and then it would get distended and feel bad again.  Had no bright red blood either per rectum or per oral.  He has been feeling weak and not well. CT scanning which showed partial small bowel obstruction. Guidance general surgery was consulted --------------------------------------------------------------------------------------------------------------------------  Principal Problem:   Partial small bowel obstruction (HCC) Active Problems:   Hyperlipidemia   Migraine syndrome   Assessment &  Plan:   Partial small bowel obstruction with resultant melena and coffee-ground emesis:  -Patient has been n.p.o., -General surgery and GI following Continue IV fluids, continue IV PPI, PRN antiemetics Reporting he has not had any episode of emesis, bowel movement has improved Pending GI evaluation workup, for possible EGD Surgery thinks that this is more likely ileus versus small bowel obstruction, agreed to initiate patient on clears now Entering H&H, stable   Migraine syndrome patient takes zonisamide every day this will have to be on hold for now.    Hyperlipidemia: All medications on hold until patient able to tolerate p.o.'s better  DVT prophylaxis: SCDs Code Status: Full code Family Communication: Spoke with patient, his wife, and their pastor who was present at the time of evaluation. Disposition Plan: Home hopefully in 3 or 4 days Consults called: Surgery Dr. Derrell Lollingamirez and GI Dr Lavon PaganiniNandigam Admission status: Inpatient   Antimicrobials:  Anti-infectives (From admission, onward)   None       Objective: Vitals:   08/23/17 1545 08/23/17 1709 08/23/17 2050 08/24/17 0602  BP: 124/90 120/81 116/82 115/76  Pulse: 80 80 77 65  Resp: (!) 23  18 17   Temp:  98.9 F (37.2 C) 98.9 F (37.2 C) 98.1 F (36.7 C)  TempSrc:  Oral Oral Oral  SpO2: 96% 95% 93% 98%  Weight:  111.9 kg (246 lb 11.1 oz)    Height:  6\' 2"  (1.88 m)      Intake/Output Summary (Last 24 hours) at 08/24/2017 1149 Last data filed at 08/24/2017 0900  Gross per 24 hour  Intake 2130.42 ml  Output -  Net 2130.42 ml   Filed Weights   08/23/17 1709  Weight: 111.9 kg (246 lb 11.1 oz)    Examination:  General exam: Appears calm and comfortable  Psychiatry: Judgement and insight appear normal. Mood & affect appropriate. HEENT: WNLs Respiratory system: Clear to auscultation. Respiratory effort normal. Cardiovascular system: S1 & S2 heard, RRR. No JVD, murmurs, rubs, gallops or clicks. No pedal  edema. Gastrointestinal system: Mild diffuse abdominal tenderness abd. nondistended, soft. No organomegaly or masses felt. Normal bowel sounds heard. Central nervous system: Alert and oriented. No focal neurological deficits. Extremities: Symmetric 5 x 5 power. Skin: No rashes, lesions or ulcers Wounds:   Data Reviewed: I have personally reviewed following labs and imaging studies  CBC: Recent Labs  Lab 08/23/17 0924 08/24/17 0611  WBC 8.5 6.2  HGB 17.3* 14.3  HCT 49.8 42.0  MCV 92.1 92.1  PLT 185 156   Basic Metabolic Panel: Recent Labs  Lab 08/23/17 0924 08/24/17 0611  NA 137 140  K 4.0 3.7  CL 103 111  CO2 22 22  GLUCOSE 135* 88  BUN 35* 24*  CREATININE 1.29* 0.95  CALCIUM 8.5* 7.6*   GFR: Estimated Creatinine Clearance: 108.7 mL/min (by C-G formula based on SCr of 0.95 mg/dL). Liver Function Tests: Recent Labs  Lab 08/23/17 0924  AST 33  ALT 30  ALKPHOS 58  BILITOT 0.7  PROT 7.5  ALBUMIN 3.9   Recent Labs  Lab 08/23/17 0924  LIPASE 19   No results for input(s): AMMONIA in the last 168 hours. Coagulation Profile: Recent Labs  Lab 08/24/17 0611  INR 1.10   Cardiac Enzymes: No results for input(s): CKTOTAL, CKMB, CKMBINDEX, TROPONINI in the last 168 hours. BNP (last 3 results) No results for input(s): PROBNP in the last 8760 hours. HbA1C: Recent Labs    08/23/17 1528  HGBA1C 5.4   CBG: Recent Labs  Lab 08/23/17 1520 08/23/17 1737 08/23/17 2049 08/24/17 0742  GLUCAP 105* 101* 99 86   No results found for this or any previous visit (from the past 240 hour(s)).    Radiology Studies: Abd 1 View (kub)  Result Date: 08/24/2017 CLINICAL DATA:  History of umbilical hernia repair, follow-up partial small bowel obstruction. EXAM: ABDOMEN - 1 VIEW COMPARISON:  Abdominal and pelvic CT scan of August 23, 2017 FINDINGS: There remain loops of mildly distended gas-filled small bowel in the mid abdomen. The degree of gaseous distention has  decreased since yesterday's CT scan. There is a moderate amount of gas and stool in the colon and rectum. There is an approximately 2 mm diameter calcification projecting over the left kidney which corresponds to a probable stone on yesterday's CT scan. There are degenerative changes of the lower lumbar spine. IMPRESSION: Persistent but improving partial small bowel obstruction. Left-sided kidney stone. Electronically Signed   By: David  [place] M.D.   On: 08/24/2017 08:24   Ct Abdomen Pelvis W Contrast  Result Date: 08/23/2017 CLINICAL DATA:  Pt states vomiting black, "flakey" emesis and having black diarrhea since early morning. States feels extremely weak. EXAM: CT ABDOMEN AND PELVIS WITH CONTRAST TECHNIQUE: Multidetector CT imaging of the abdomen and pelvis was performed using the standard protocol following bolus administration of intravenous contrast. CONTRAST:  ISOVUE-300 IOPAMIDOL (ISOVUE-300) INJECTION 61% COMPARISON:  None. FINDINGS: Lower chest: Small calcified granuloma in the right middle lobe. Small noncalcified, 4 mm, nodule in the lateral, subpleural right lower lobe, image  7, series 4. Lung bases otherwise clear. Heart normal in size.a Hepatobiliary: 14 mm low-density lesion in the left liver lobe, segment 2, consistent with a cyst. No other liver liver masses or focal lesions. Gallbladder is distended with no wall thickening or adjacent inflammation. No bile duct dilation. Pancreas: Unremarkable. No pancreatic ductal dilatation or surrounding inflammatory changes. Spleen: Normal in size without focal abnormality. Adrenals/Urinary Tract: No adrenal masses. Single small nonobstructing stones in each kidney. 3.2 cm upper pole cyst on the right. 13 mm midpole cyst on the left. 3.1 cm exophytic lower pole cyst on the left. No other renal masses. No hydronephrosis. Ureters are normal in course and in caliber. Bladder is unremarkable. Stomach/Bowel: Stomach is moderately distended. No stomach  wall thickening or adjacent inflammation. There is mild dilation of the proximal through the mid small bowel, maximum diameter of 4.2 cm. In the right upper quadrant there are 2 possible transition points where the small bowel narrows. Distal to this, the small bowel is mostly decompressed. There is no small bowel wall thickening or adjacent inflammation. The colon is nondilated. There are few small diverticula, but no evidence of diverticulitis or other colonic inflammatory process. Normal appendix is visualized. Vascular/Lymphatic: No significant vascular findings are present. No enlarged abdominal or pelvic lymph nodes. Reproductive: Unremarkable. Other: No abdominal wall hernia.  No ascites. Musculoskeletal: No fracture or acute finding. No osteoblastic or osteolytic lesions. IMPRESSION: 1. Findings are consistent with a grade partial small bowel obstruction, with an apparent transition point in the right upper quadrant. There are 2 areas of narrowing of the ileum in the right upper quadrant, which may be the cause of the partial obstruction. These areas of apparent narrowing could be due to spasm as opposed to a stricture or adhesion. Follow-up small bowel follow-through study may be beneficial. 2. No other evidence of an acute abnormality within the abdomen or pelvis. 3. 4 mm noncalcified right lower lobe pulmonary nodule. If there is no evidence of malignancy in this patient, the following would be recommended. No follow-up needed if patient is low-risk. Non-contrast chest CT can be considered in 12 months if patient is high-risk. This recommendation follows the consensus statement: Guidelines for Management of Incidental Pulmonary Nodules Detected on CT Images: From the Fleischner Society 2017; Radiology 2017; 284:228-243. 4. Small liver cyst and bilateral renal cysts. Electronically Signed   By: Amie Portland M.D.   On: 08/23/2017 11:59    Scheduled Meds: . insulin aspart  0-9 Units Subcutaneous TID WC   . sodium chloride flush  3 mL Intravenous Q12H   Continuous Infusions: . sodium chloride    . 0.9 % NaCl with KCl 20 mEq / L 125 mL/hr at 08/24/17 0742  . pantoprozole (PROTONIX) infusion 8 mg/hr (08/23/17 2354)    Time spent: >25 minutes  Kendell Bane, MD [facility],  Pager [phone]  If 7PM-7AM, please contact night-coverage [email or web address]   Password [facility]  08/24/2017, 11:49 AM

## 2017-08-25 ENCOUNTER — Other Ambulatory Visit: Payer: Self-pay

## 2017-08-25 ENCOUNTER — Encounter (HOSPITAL_COMMUNITY): Payer: Self-pay | Admitting: General Practice

## 2017-08-25 DIAGNOSIS — K56600 Partial intestinal obstruction, unspecified as to cause: Secondary | ICD-10-CM | POA: Diagnosis not present

## 2017-08-25 DIAGNOSIS — G43909 Migraine, unspecified, not intractable, without status migrainosus: Secondary | ICD-10-CM

## 2017-08-25 DIAGNOSIS — E785 Hyperlipidemia, unspecified: Secondary | ICD-10-CM

## 2017-08-25 DIAGNOSIS — A044 Other intestinal Escherichia coli infections: Secondary | ICD-10-CM | POA: Diagnosis not present

## 2017-08-25 DIAGNOSIS — R1084 Generalized abdominal pain: Secondary | ICD-10-CM | POA: Diagnosis not present

## 2017-08-25 LAB — BASIC METABOLIC PANEL
ANION GAP: 6 (ref 5–15)
BUN: 11 mg/dL (ref 6–20)
CALCIUM: 7.7 mg/dL — AB (ref 8.9–10.3)
CHLORIDE: 112 mmol/L — AB (ref 101–111)
CO2: 21 mmol/L — AB (ref 22–32)
Creatinine, Ser: 0.75 mg/dL (ref 0.61–1.24)
GFR calc Af Amer: >60 mL/min (ref >60)
GFR calc non Af Amer: >60 mL/min (ref >60)
GLUCOSE: 93 mg/dL (ref 65–99)
POTASSIUM: 3.8 mmol/L (ref 3.5–5.1)
Sodium: 139 mmol/L (ref 135–145)

## 2017-08-25 LAB — GLUCOSE, CAPILLARY: Glucose-Capillary: 87 mg/dL (ref 65–99)

## 2017-08-25 LAB — CBC
HEMATOCRIT: 39.3 % (ref 39.0–52.0)
Hemoglobin: 13.1 g/dL (ref 13.0–17.0)
MCH: 30.5 pg (ref 26.0–34.0)
MCHC: 33.3 g/dL (ref 30.0–36.0)
MCV: 91.6 fL (ref 78.0–100.0)
Platelets: 156 10*3/uL (ref 150–400)
RBC: 4.29 MIL/uL (ref 4.22–5.81)
RDW: 13.2 % (ref 11.5–15.5)
WBC: 5.5 10*3/uL (ref 4.0–10.5)

## 2017-08-25 MED ORDER — PANTOPRAZOLE SODIUM 40 MG PO TBEC: 40.0000 mg | DELAYED_RELEASE_TABLET | Freq: Every day | ORAL | 0 refills | Status: DC

## 2017-08-25 MED ORDER — ONDANSETRON HCL 4 MG PO TABS: 4.0000 mg | ORAL_TABLET | Freq: Four times a day (QID) | ORAL | 0 refills | Status: DC | PRN

## 2017-08-25 NOTE — Discharge Summary (Signed)
Physician Discharge Summary  Kevin Patton VWU:[id number] DOB: 22-Nov-1955 DOA: 08/23/2017  PCP: Karleen Hampshire, MD  Admit date: 08/23/2017 Discharge date: 08/25/2017  Admitted From: Home Disposition:  Home  Recommendations for Outpatient Follow-up:  1. Follow up with PCP in 1week    Home Health: No Equipment/Devices: None Discharge Condition: Stable CODE STATUS: Full  diet recommendation: Heart Healthy   Brief/Interim Summary: 62 year old male with history of internal hemorrhoids, elevated cholesterol and chronic migraines presented with vomiting, diarrhea and abdominal pain.  He was admitted with acute gastroenteritis with ileus.  GI and general surgery were consulted.  Stool tested positive for Norovirus and enterotoxigenic E. Coli.  There was a concern for black emesis and black stools and initially the plan was for probable EGD.  Hemoglobin has remained stable, GI decided to manage conservatively oral Protonix.  Patient has felt much better and is ready for discharge.  Discharge Diagnoses:  Principal Problem:   Partial small bowel obstruction (HCC) Active Problems:   Hyperlipidemia   Migraine syndrome   Norovirus   E coli enteritis  Acute gastroenteritis with ileus versus partial small bowel obstruction -Stool testing positive for Norovirus and enterotoxigenic E. coli.  GI and general surgery evaluation appreciated.  Symptoms improving.  He is having bowel movements and tolerating diet.  Patient does not need any antibiotic treatment for his E. coli as the symptoms are improving. -There is a question of black emesis and black stools for which GI recommends Protonix 40 mg daily for a month and outpatient follow-up with primary gastroenterologist, Dr. Kinnie Scales.  Hemoglobin has remained stable.  Hold aspirin -Discharge patient home today  History of migraine -Continue zonisamide.  Outpatient follow-up  Hyperlipidemia Continue simvastatin    Discharge  Instructions  Discharge Instructions    Call MD for:  difficulty breathing, headache or visual disturbances   Complete by:  As directed    Call MD for:  extreme fatigue   Complete by:  As directed    Call MD for:  hives   Complete by:  As directed    Call MD for:  persistant dizziness or light-headedness   Complete by:  As directed    Call MD for:  persistant nausea and vomiting   Complete by:  As directed    Call MD for:  severe uncontrolled pain   Complete by:  As directed    Call MD for:  temperature >100.4   Complete by:  As directed    Diet - low sodium heart healthy   Complete by:  As directed    Increase activity slowly   Complete by:  As directed      Allergies as of 08/25/2017   No Known Allergies     Medication List    STOP taking these medications   acetaminophen 650 MG CR tablet Commonly known as:  TYLENOL   aspirin 325 MG tablet   METAMUCIL PO     TAKE these medications   FISH OIL PO Take 1 capsule by mouth at bedtime.   multivitamin with minerals Tabs tablet Take 1 tablet by mouth at bedtime. Men's Multi Vitamin   ondansetron 4 MG tablet Commonly known as:  ZOFRAN Take 1 tablet (4 mg total) by mouth every 6 (six) hours as needed for nausea.   pantoprazole 40 MG tablet Commonly known as:  PROTONIX Take 1 tablet (40 mg total) by mouth daily.   PRESCRIPTION MEDICATION Inhale into the lungs at bedtime. BIPAP   simvastatin 20 MG tablet  Commonly known as:  ZOCOR Take 20 mg by mouth at bedtime.   zonisamide 50 MG capsule Commonly known as:  ZONEGRAN Take 150 mg by mouth at bedtime.      Follow-up Information    Karleen Hampshire, MD. Schedule an appointment as soon as possible for a visit in 1 week(s).   Specialty:  Family Medicine Contact information: [address] [place] [address] [phone]          No Known Allergies  Consultations:  GI and general surgery   Procedures/Studies: Abd 1 View (kub)  Result  Date: 08/24/2017 CLINICAL DATA:  History of umbilical hernia repair, follow-up partial small bowel obstruction. EXAM: ABDOMEN - 1 VIEW COMPARISON:  Abdominal and pelvic CT scan of August 23, 2017 FINDINGS: There remain loops of mildly distended gas-filled small bowel in the mid abdomen. The degree of gaseous distention has decreased since yesterday's CT scan. There is a moderate amount of gas and stool in the colon and rectum. There is an approximately 2 mm diameter calcification projecting over the left kidney which corresponds to a probable stone on yesterday's CT scan. There are degenerative changes of the lower lumbar spine. IMPRESSION: Persistent but improving partial small bowel obstruction. Left-sided kidney stone. Electronically Signed   By: David  [place] M.D.   On: 08/24/2017 08:24   Ct Abdomen Pelvis W Contrast  Result Date: 08/23/2017 CLINICAL DATA:  Pt states vomiting black, "flakey" emesis and having black diarrhea since early morning. States feels extremely weak. EXAM: CT ABDOMEN AND PELVIS WITH CONTRAST TECHNIQUE: Multidetector CT imaging of the abdomen and pelvis was performed using the standard protocol following bolus administration of intravenous contrast. CONTRAST:  ISOVUE-300 IOPAMIDOL (ISOVUE-300) INJECTION 61% COMPARISON:  None. FINDINGS: Lower chest: Small calcified granuloma in the right middle lobe. Small noncalcified, 4 mm, nodule in the lateral, subpleural right lower lobe, image 7, series 4. Lung bases otherwise clear. Heart normal in size.a Hepatobiliary: 14 mm low-density lesion in the left liver lobe, segment 2, consistent with a cyst. No other liver liver masses or focal lesions. Gallbladder is distended with no wall thickening or adjacent inflammation. No bile duct dilation. Pancreas: Unremarkable. No pancreatic ductal dilatation or surrounding inflammatory changes. Spleen: Normal in size without focal abnormality. Adrenals/Urinary Tract: No adrenal masses. Single small  nonobstructing stones in each kidney. 3.2 cm upper pole cyst on the right. 13 mm midpole cyst on the left. 3.1 cm exophytic lower pole cyst on the left. No other renal masses. No hydronephrosis. Ureters are normal in course and in caliber. Bladder is unremarkable. Stomach/Bowel: Stomach is moderately distended. No stomach wall thickening or adjacent inflammation. There is mild dilation of the proximal through the mid small bowel, maximum diameter of 4.2 cm. In the right upper quadrant there are 2 possible transition points where the small bowel narrows. Distal to this, the small bowel is mostly decompressed. There is no small bowel wall thickening or adjacent inflammation. The colon is nondilated. There are few small diverticula, but no evidence of diverticulitis or other colonic inflammatory process. Normal appendix is visualized. Vascular/Lymphatic: No significant vascular findings are present. No enlarged abdominal or pelvic lymph nodes. Reproductive: Unremarkable. Other: No abdominal wall hernia.  No ascites. Musculoskeletal: No fracture or acute finding. No osteoblastic or osteolytic lesions. IMPRESSION: 1. Findings are consistent with a grade partial small bowel obstruction, with an apparent transition point in the right upper quadrant. There are 2 areas of narrowing of the ileum in the right upper quadrant, which  may be the cause of the partial obstruction. These areas of apparent narrowing could be due to spasm as opposed to a stricture or adhesion. Follow-up small bowel follow-through study may be beneficial. 2. No other evidence of an acute abnormality within the abdomen or pelvis. 3. 4 mm noncalcified right lower lobe pulmonary nodule. If there is no evidence of malignancy in this patient, the following would be recommended. No follow-up needed if patient is low-risk. Non-contrast chest CT can be considered in 12 months if patient is high-risk. This recommendation follows the consensus statement:  Guidelines for Management of Incidental Pulmonary Nodules Detected on CT Images: From the Fleischner Society 2017; Radiology 2017; 284:228-243. 4. Small liver cyst and bilateral renal cysts. Electronically Signed   By: Amie Portland M.D.   On: 08/23/2017 11:59       Subjective: Patient seen and examined.  He feels much better and wants to go home.  His last bowel movement was last night.  He is tolerating diet.  No overnight fever or vomiting.  Discharge Exam: Vitals:   08/24/17 2102 08/25/17 0531  BP: 127/82 108/72  Pulse: 72 66  Resp: 17   Temp: 98.3 F (36.8 C) 97.6 F (36.4 C)  SpO2: 98% 99%   Vitals:   08/24/17 0602 08/24/17 1458 08/24/17 2102 08/25/17 0531  BP: 115/76 116/75 127/82 108/72  Pulse: 65 70 72 66  Resp: 17 18 17    Temp: 98.1 F (36.7 C) 98.4 F (36.9 C) 98.3 F (36.8 C) 97.6 F (36.4 C)  TempSrc: Oral Oral Oral Oral  SpO2: 98% 98% 98% 99%  Weight:      Height:        General: Pt is alert, awake, not in acute distress Cardiovascular: Rate controlled, S1/S2 + Respiratory: Bilateral decreased breath sounds at bases Abdominal: Soft, NT, ND, bowel sounds + Extremities: no edema, no cyanosis    The results of significant diagnostics from this hospitalization (including imaging, microbiology, ancillary and laboratory) are listed below for reference.     Microbiology: Recent Results (from the past 240 hour(s))  Gastrointestinal Panel by PCR , Stool     Status: Abnormal   Collection Time: 08/23/17  5:50 PM  Result Value Ref Range Status   Campylobacter species NOT DETECTED NOT DETECTED Final   Plesimonas shigelloides NOT DETECTED NOT DETECTED Final   Salmonella species NOT DETECTED NOT DETECTED Final   Yersinia enterocolitica NOT DETECTED NOT DETECTED Final   Vibrio species NOT DETECTED NOT DETECTED Final   Vibrio cholerae NOT DETECTED NOT DETECTED Final   Enteroaggregative E coli (EAEC) NOT DETECTED NOT DETECTED Final   Enteropathogenic E coli  (EPEC) NOT DETECTED NOT DETECTED Final   Enterotoxigenic E coli (ETEC) DETECTED (A) NOT DETECTED Final    Comment: RESULT CALLED TO, READ BACK BY AND VERIFIED WITH: LUISA THOMPSON AT 1156 08/24/17 SDR    Shiga like toxin producing E coli (STEC) NOT DETECTED NOT DETECTED Final   Shigella/Enteroinvasive E coli (EIEC) NOT DETECTED NOT DETECTED Final   Cryptosporidium NOT DETECTED NOT DETECTED Final   Cyclospora cayetanensis NOT DETECTED NOT DETECTED Final   Entamoeba histolytica NOT DETECTED NOT DETECTED Final   Giardia lamblia NOT DETECTED NOT DETECTED Final   Adenovirus F40/41 NOT DETECTED NOT DETECTED Final   Astrovirus NOT DETECTED NOT DETECTED Final   Norovirus GI/GII DETECTED (A) NOT DETECTED Final    Comment: RESULT CALLED TO, READ BACK BY AND VERIFIED WITH:  LUISA THOMPSON AT 1156 08/24/17 SDR  Rotavirus A NOT DETECTED NOT DETECTED Final   Sapovirus (I, II, IV, and V) NOT DETECTED NOT DETECTED Final    Comment: Performed at [facility], [address] [place], [place] [address]     Labs: BNP (last 3 results) Recent Labs    08/23/17 1108  BNP 22.3   Basic Metabolic Panel: Recent Labs  Lab 08/23/17 0924 08/24/17 0611 08/25/17 0454  NA 137 140 139  K 4.0 3.7 3.8  CL 103 111 112*  CO2 22 22 21*  GLUCOSE 135* 88 93  BUN 35* 24* 11  CREATININE 1.29* 0.95 0.75  CALCIUM 8.5* 7.6* 7.7*   Liver Function Tests: Recent Labs  Lab 08/23/17 0924  AST 33  ALT 30  ALKPHOS 58  BILITOT 0.7  PROT 7.5  ALBUMIN 3.9   Recent Labs  Lab 08/23/17 0924  LIPASE 19   No results for input(s): AMMONIA in the last 168 hours. CBC: Recent Labs  Lab 08/23/17 0924 08/24/17 0611 08/25/17 0454  WBC 8.5 6.2 5.5  HGB 17.3* 14.3 13.1  HCT 49.8 42.0 39.3  MCV 92.1 92.1 91.6  PLT 185 156 156   Cardiac Enzymes: No results for input(s): CKTOTAL, CKMB, CKMBINDEX, TROPONINI in the last 168 hours. BNP: Invalid input(s): POCBNP CBG: Recent Labs  Lab 08/24/17 0742  08/24/17 1308 08/24/17 1658 08/24/17 2100 08/25/17 0818  GLUCAP 86 63* 88 86 87   D-Dimer No results for input(s): DDIMER in the last 72 hours. Hgb A1c Recent Labs    08/23/17 1528  HGBA1C 5.4   Lipid Profile No results for input(s): CHOL, HDL, LDLCALC, TRIG, CHOLHDL, LDLDIRECT in the last 72 hours. Thyroid function studies No results for input(s): TSH, T4TOTAL, T3FREE, THYROIDAB in the last 72 hours.  Invalid input(s): FREET3 Anemia work up No results for input(s): VITAMINB12, FOLATE, FERRITIN, TIBC, IRON, RETICCTPCT in the last 72 hours. Urinalysis    Component Value Date/Time   COLORURINE AMBER (A) 08/23/2017 1108   APPEARANCEUR HAZY (A) 08/23/2017 1108   LABSPEC 1.031 (H) 08/23/2017 1108   PHURINE 5.0 08/23/2017 1108   GLUCOSEU NEGATIVE 08/23/2017 1108   HGBUR NEGATIVE 08/23/2017 1108   BILIRUBINUR NEGATIVE 08/23/2017 1108   KETONESUR NEGATIVE 08/23/2017 1108   PROTEINUR 100 (A) 08/23/2017 1108   NITRITE NEGATIVE 08/23/2017 1108   LEUKOCYTESUR NEGATIVE 08/23/2017 1108   Sepsis Labs Invalid input(s): PROCALCITONIN,  WBC,  LACTICIDVEN Microbiology Recent Results (from the past 240 hour(s))  Gastrointestinal Panel by PCR , Stool     Status: Abnormal   Collection Time: 08/23/17  5:50 PM  Result Value Ref Range Status   Campylobacter species NOT DETECTED NOT DETECTED Final   Plesimonas shigelloides NOT DETECTED NOT DETECTED Final   Salmonella species NOT DETECTED NOT DETECTED Final   Yersinia enterocolitica NOT DETECTED NOT DETECTED Final   Vibrio species NOT DETECTED NOT DETECTED Final   Vibrio cholerae NOT DETECTED NOT DETECTED Final   Enteroaggregative E coli (EAEC) NOT DETECTED NOT DETECTED Final   Enteropathogenic E coli (EPEC) NOT DETECTED NOT DETECTED Final   Enterotoxigenic E coli (ETEC) DETECTED (A) NOT DETECTED Final    Comment: RESULT CALLED TO, READ BACK BY AND VERIFIED WITH: LUISA THOMPSON AT 1156 08/24/17 SDR    Shiga like toxin producing E coli  (STEC) NOT DETECTED NOT DETECTED Final   Shigella/Enteroinvasive E coli (EIEC) NOT DETECTED NOT DETECTED Final   Cryptosporidium NOT DETECTED NOT DETECTED Final   Cyclospora cayetanensis NOT DETECTED NOT DETECTED Final   Entamoeba  histolytica NOT DETECTED NOT DETECTED Final   Giardia lamblia NOT DETECTED NOT DETECTED Final   Adenovirus F40/41 NOT DETECTED NOT DETECTED Final   Astrovirus NOT DETECTED NOT DETECTED Final   Norovirus GI/GII DETECTED (A) NOT DETECTED Final    Comment: RESULT CALLED TO, READ BACK BY AND VERIFIED WITH:  LUISA THOMPSON AT 1156 08/24/17 SDR    Rotavirus A NOT DETECTED NOT DETECTED Final   Sapovirus (I, II, IV, and V) NOT DETECTED NOT DETECTED Final    Comment: Performed at [facility], [address] [place], [place] [id number]     Time coordinating discharge: 35 minutes  SIGNED:   Glade LloydKshitiz Adrien Shankar, MD  [facility] 08/25/2017, 8:41 AM Pager: [phone]  If 7PM-7AM, please contact night-coverage [email or web address] Password [email or web address]

## 2017-08-25 NOTE — Progress Notes (Signed)
[facility] Progress Note     Subjective: CC-  Patient reports no abdominal pain, nausea, or vomiting. He is tolerating soft diet. States that he is still having 6-7 loose stool daily.  Objective: Vital signs in last 24 hours: Temp:  [97.6 F (36.4 C)-98.4 F (36.9 C)] 97.6 F (36.4 C) (02/26 0531) Pulse Rate:  [66-72] 66 (02/26 0531) Resp:  [17-18] 17 (02/25 2102) BP: (108-127)/(72-82) 108/72 (02/26 0531) SpO2:  [98 %-99 %] 99 % (02/26 0531) Last BM Date: 08/25/17  Intake/Output from previous day: 02/25 0701 - 02/26 0700 In: 2825 [P.O.:180; I.V.:2645] Out: 1 [Urine:1] Intake/Output this shift: No intake/output data recorded.  PE: Gen:  Alert, NAD, pleasant HEENT: EOM's intact, pupils equal and round Pulm:  effort normal Abd: Soft, NT/ND, no HSM, no hernia Psych: A&Ox3  Skin: no rashes noted, warm and dry  Lab Results:  Recent Labs    08/24/17 0611 08/25/17 0454  WBC 6.2 5.5  HGB 14.3 13.1  HCT 42.0 39.3  PLT 156 156   BMET Recent Labs    08/24/17 0611 08/25/17 0454  NA 140 139  K 3.7 3.8  CL 111 112*  CO2 22 21*  GLUCOSE 88 93  BUN 24* 11  CREATININE 0.95 0.75  CALCIUM 7.6* 7.7*   PT/INR Recent Labs    08/24/17 0611  LABPROT 14.1  INR 1.10   CMP     Component Value Date/Time   NA 139 08/25/2017 0454   K 3.8 08/25/2017 0454   CL 112 (H) 08/25/2017 0454   CO2 21 (L) 08/25/2017 0454   GLUCOSE 93 08/25/2017 0454   BUN 11 08/25/2017 0454   CREATININE 0.75 08/25/2017 0454   CALCIUM 7.7 (L) 08/25/2017 0454   PROT 7.5 08/23/2017 0924   ALBUMIN 3.9 08/23/2017 0924   AST 33 08/23/2017 0924   ALT 30 08/23/2017 0924   ALKPHOS 58 08/23/2017 0924   BILITOT 0.7 08/23/2017 0924   GFRNONAA >60 08/25/2017 0454   GFRAA >60 08/25/2017 0454   Lipase     Component Value Date/Time   LIPASE 19 08/23/2017 0924       Studies/Results: Abd 1 View (kub)  Result Date: 08/24/2017 CLINICAL DATA:  History of umbilical hernia repair,  follow-up partial small bowel obstruction. EXAM: ABDOMEN - 1 VIEW COMPARISON:  Abdominal and pelvic CT scan of August 23, 2017 FINDINGS: There remain loops of mildly distended gas-filled small bowel in the mid abdomen. The degree of gaseous distention has decreased since yesterday's CT scan. There is a moderate amount of gas and stool in the colon and rectum. There is an approximately 2 mm diameter calcification projecting over the left kidney which corresponds to a probable stone on yesterday's CT scan. There are degenerative changes of the lower lumbar spine. IMPRESSION: Persistent but improving partial small bowel obstruction. Left-sided kidney stone. Electronically Signed   By: David  SwazilandJordan M.D.   On: 08/24/2017 08:24   Ct Abdomen Pelvis W Contrast  Result Date: 08/23/2017 CLINICAL DATA:  Pt states vomiting black, "flakey" emesis and having black diarrhea since early morning. States feels extremely weak. EXAM: CT ABDOMEN AND PELVIS WITH CONTRAST TECHNIQUE: Multidetector CT imaging of the abdomen and pelvis was performed using the standard protocol following bolus administration of intravenous contrast. CONTRAST:  100mL ISOVUE-300 IOPAMIDOL (ISOVUE-300) INJECTION 61% COMPARISON:  None. FINDINGS: Lower chest: Small calcified granuloma in the right middle lobe. Small noncalcified, 4 mm, nodule in the lateral, subpleural right lower lobe, image 7, series 4.  Lung bases otherwise clear. Heart normal in size.a Hepatobiliary: 14 mm low-density lesion in the left liver lobe, segment 2, consistent with a cyst. No other liver liver masses or focal lesions. Gallbladder is distended with no wall thickening or adjacent inflammation. No bile duct dilation. Pancreas: Unremarkable. No pancreatic ductal dilatation or surrounding inflammatory changes. Spleen: Normal in size without focal abnormality. Adrenals/Urinary Tract: No adrenal masses. Single small nonobstructing stones in each kidney. 3.2 cm upper pole cyst on the  right. 13 mm midpole cyst on the left. 3.1 cm exophytic lower pole cyst on the left. No other renal masses. No hydronephrosis. Ureters are normal in course and in caliber. Bladder is unremarkable. Stomach/Bowel: Stomach is moderately distended. No stomach wall thickening or adjacent inflammation. There is mild dilation of the proximal through the mid small bowel, maximum diameter of 4.2 cm. In the right upper quadrant there are 2 possible transition points where the small bowel narrows. Distal to this, the small bowel is mostly decompressed. There is no small bowel wall thickening or adjacent inflammation. The colon is nondilated. There are few small diverticula, but no evidence of diverticulitis or other colonic inflammatory process. Normal appendix is visualized. Vascular/Lymphatic: No significant vascular findings are present. No enlarged abdominal or pelvic lymph nodes. Reproductive: Unremarkable. Other: No abdominal wall hernia.  No ascites. Musculoskeletal: No fracture or acute finding. No osteoblastic or osteolytic lesions. IMPRESSION: 1. Findings are consistent with a grade partial small bowel obstruction, with an apparent transition point in the right upper quadrant. There are 2 areas of narrowing of the ileum in the right upper quadrant, which may be the cause of the partial obstruction. These areas of apparent narrowing could be due to spasm as opposed to a stricture or adhesion. Follow-up small bowel follow-through study may be beneficial. 2. No other evidence of an acute abnormality within the abdomen or pelvis. 3. 4 mm noncalcified right lower lobe pulmonary nodule. If there is no evidence of malignancy in this patient, the following would be recommended. No follow-up needed if patient is low-risk. Non-contrast chest CT can be considered in 12 months if patient is high-risk. This recommendation follows the consensus statement: Guidelines for Management of Incidental Pulmonary Nodules Detected on CT  Images: From the Fleischner Society 2017; Radiology 2017; 284:228-243. 4. Small liver cyst and bilateral renal cysts. Electronically Signed   By: Amie Portland M.D.   On: 08/23/2017 11:59    Anti-infectives: Anti-infectives (From admission, onward)   None       Assessment/Plan Ileus 2/2 Norovirus and enterotoxigenic E coli - Abdomen nontender and patient tolerating soft diet - General surgery will sign off, please call with concerns.  ID - none FEN - IVF, soft diet VTE - SCDs Foley - none Follow up - no surgical follow up   LOS: 2 days    Kevin Patton , [facility] 08/25/2017, 8:10 AM Pager: [phone] Consults: [phone] Mon-Fri 7:00 am-4:30 pm Sat-Sun 7:00 am-11:30 am

## 2017-08-25 NOTE — Progress Notes (Signed)
Discharge home. Home discharge instruction given, no question verbalized. 

## 2017-08-27 DIAGNOSIS — Z6831 Body mass index (BMI) 31.0-31.9, adult: Secondary | ICD-10-CM | POA: Diagnosis not present

## 2017-08-27 DIAGNOSIS — A0811 Acute gastroenteropathy due to Norwalk agent: Secondary | ICD-10-CM | POA: Diagnosis not present

## 2017-08-27 DIAGNOSIS — Z1389 Encounter for screening for other disorder: Secondary | ICD-10-CM | POA: Diagnosis not present

## 2017-08-27 DIAGNOSIS — A041 Enterotoxigenic Escherichia coli infection: Secondary | ICD-10-CM | POA: Diagnosis not present

## 2017-08-27 DIAGNOSIS — E6609 Other obesity due to excess calories: Secondary | ICD-10-CM | POA: Diagnosis not present

## 2017-12-11 DIAGNOSIS — G4733 Obstructive sleep apnea (adult) (pediatric): Secondary | ICD-10-CM | POA: Diagnosis not present

## 2018-01-13 DIAGNOSIS — K648 Other hemorrhoids: Secondary | ICD-10-CM | POA: Diagnosis not present

## 2018-01-25 DIAGNOSIS — G43719 Chronic migraine without aura, intractable, without status migrainosus: Secondary | ICD-10-CM | POA: Diagnosis not present

## 2018-01-27 DIAGNOSIS — K648 Other hemorrhoids: Secondary | ICD-10-CM | POA: Diagnosis not present

## 2018-03-09 DIAGNOSIS — G43719 Chronic migraine without aura, intractable, without status migrainosus: Secondary | ICD-10-CM | POA: Diagnosis not present

## 2018-03-24 DIAGNOSIS — K648 Other hemorrhoids: Secondary | ICD-10-CM | POA: Diagnosis not present

## 2018-03-31 DIAGNOSIS — Z0000 Encounter for general adult medical examination without abnormal findings: Secondary | ICD-10-CM | POA: Diagnosis not present

## 2018-03-31 DIAGNOSIS — Z6832 Body mass index (BMI) 32.0-32.9, adult: Secondary | ICD-10-CM | POA: Diagnosis not present

## 2018-03-31 DIAGNOSIS — Z23 Encounter for immunization: Secondary | ICD-10-CM | POA: Diagnosis not present

## 2018-03-31 DIAGNOSIS — E6609 Other obesity due to excess calories: Secondary | ICD-10-CM | POA: Diagnosis not present

## 2018-03-31 DIAGNOSIS — Z1389 Encounter for screening for other disorder: Secondary | ICD-10-CM | POA: Diagnosis not present

## 2018-04-01 DIAGNOSIS — Z0000 Encounter for general adult medical examination without abnormal findings: Secondary | ICD-10-CM | POA: Diagnosis not present

## 2018-04-01 DIAGNOSIS — R739 Hyperglycemia, unspecified: Secondary | ICD-10-CM | POA: Diagnosis not present

## 2018-04-01 DIAGNOSIS — Z23 Encounter for immunization: Secondary | ICD-10-CM | POA: Diagnosis not present

## 2018-04-01 DIAGNOSIS — Z6832 Body mass index (BMI) 32.0-32.9, adult: Secondary | ICD-10-CM | POA: Diagnosis not present

## 2018-04-21 DIAGNOSIS — K648 Other hemorrhoids: Secondary | ICD-10-CM | POA: Diagnosis not present

## 2018-05-03 ENCOUNTER — Ambulatory Visit (INDEPENDENT_AMBULATORY_CARE_PROVIDER_SITE_OTHER): Payer: Federal, State, Local not specified - PPO | Admitting: Otolaryngology

## 2018-05-03 DIAGNOSIS — J342 Deviated nasal septum: Secondary | ICD-10-CM

## 2018-05-03 DIAGNOSIS — R490 Dysphonia: Secondary | ICD-10-CM | POA: Diagnosis not present

## 2018-05-03 DIAGNOSIS — H903 Sensorineural hearing loss, bilateral: Secondary | ICD-10-CM | POA: Diagnosis not present

## 2018-05-03 DIAGNOSIS — R430 Anosmia: Secondary | ICD-10-CM

## 2018-05-19 DIAGNOSIS — K648 Other hemorrhoids: Secondary | ICD-10-CM | POA: Diagnosis not present

## 2018-05-31 ENCOUNTER — Ambulatory Visit (INDEPENDENT_AMBULATORY_CARE_PROVIDER_SITE_OTHER): Payer: Federal, State, Local not specified - PPO | Admitting: Otolaryngology

## 2018-05-31 DIAGNOSIS — J310 Chronic rhinitis: Secondary | ICD-10-CM | POA: Diagnosis not present

## 2018-05-31 DIAGNOSIS — J342 Deviated nasal septum: Secondary | ICD-10-CM | POA: Diagnosis not present

## 2018-05-31 DIAGNOSIS — J343 Hypertrophy of nasal turbinates: Secondary | ICD-10-CM | POA: Diagnosis not present

## 2018-06-03 ENCOUNTER — Other Ambulatory Visit (INDEPENDENT_AMBULATORY_CARE_PROVIDER_SITE_OTHER): Payer: Self-pay | Admitting: Otolaryngology

## 2018-06-03 DIAGNOSIS — J320 Chronic maxillary sinusitis: Secondary | ICD-10-CM

## 2018-06-11 ENCOUNTER — Ambulatory Visit (HOSPITAL_COMMUNITY)
Admission: RE | Admit: 2018-06-11 | Discharge: 2018-06-11 | Disposition: A | Discharge status: Home / Self Care | Payer: Federal, State, Local not specified - PPO | Source: Ambulatory Visit | Attending: Otolaryngology | Admitting: Otolaryngology

## 2018-06-11 DIAGNOSIS — J320 Chronic maxillary sinusitis: Secondary | ICD-10-CM | POA: Diagnosis not present

## 2018-07-01 ENCOUNTER — Ambulatory Visit (INDEPENDENT_AMBULATORY_CARE_PROVIDER_SITE_OTHER): Payer: Federal, State, Local not specified - PPO | Admitting: Otolaryngology

## 2018-07-01 DIAGNOSIS — J342 Deviated nasal septum: Secondary | ICD-10-CM

## 2018-07-01 DIAGNOSIS — J321 Chronic frontal sinusitis: Secondary | ICD-10-CM

## 2018-07-01 DIAGNOSIS — J320 Chronic maxillary sinusitis: Secondary | ICD-10-CM

## 2018-07-01 DIAGNOSIS — J322 Chronic ethmoidal sinusitis: Secondary | ICD-10-CM

## 2018-07-06 DIAGNOSIS — G43719 Chronic migraine without aura, intractable, without status migrainosus: Secondary | ICD-10-CM | POA: Diagnosis not present

## 2018-07-26 ENCOUNTER — Other Ambulatory Visit: Payer: Self-pay | Admitting: Otolaryngology

## 2018-08-03 DIAGNOSIS — G4733 Obstructive sleep apnea (adult) (pediatric): Secondary | ICD-10-CM | POA: Diagnosis not present

## 2018-08-24 ENCOUNTER — Encounter (HOSPITAL_BASED_OUTPATIENT_CLINIC_OR_DEPARTMENT_OTHER)
Admission: RE | Admit: 2018-08-24 | Discharge: 2018-08-24 | Disposition: A | Discharge status: Home / Self Care | Payer: Federal, State, Local not specified - PPO | Source: Ambulatory Visit | Attending: Otolaryngology | Admitting: Otolaryngology

## 2018-08-24 ENCOUNTER — Other Ambulatory Visit: Payer: Self-pay

## 2018-08-24 ENCOUNTER — Encounter (HOSPITAL_BASED_OUTPATIENT_CLINIC_OR_DEPARTMENT_OTHER): Payer: Self-pay | Admitting: *Deleted

## 2018-08-24 DIAGNOSIS — Z01810 Encounter for preprocedural cardiovascular examination: Secondary | ICD-10-CM | POA: Diagnosis not present

## 2018-08-24 NOTE — Progress Notes (Addendum)
Discussed with Dr. Desmond Lope, pt has incomplete Right bundle Branch block on previous EKG;s, last seen by Dr. Eden Emms in 2008. Dr. Desmond Lope ordered to get EKG. Pt coming today for EKG. Bring Bipap Machine day of surgery.

## 2018-08-25 NOTE — Progress Notes (Signed)
EKG reviewed by Dr. R. Fitzgerald, will proceed with surgery as scheduled. 

## 2018-08-30 ENCOUNTER — Encounter (HOSPITAL_BASED_OUTPATIENT_CLINIC_OR_DEPARTMENT_OTHER): Payer: Self-pay

## 2018-08-30 ENCOUNTER — Ambulatory Visit (HOSPITAL_BASED_OUTPATIENT_CLINIC_OR_DEPARTMENT_OTHER): Payer: Federal, State, Local not specified - PPO | Admitting: Anesthesiology

## 2018-08-30 ENCOUNTER — Encounter (HOSPITAL_BASED_OUTPATIENT_CLINIC_OR_DEPARTMENT_OTHER)
Admission: RE | Disposition: A | Discharge status: Home / Self Care | Payer: Self-pay | Source: Home / Self Care | Attending: Otolaryngology

## 2018-08-30 ENCOUNTER — Ambulatory Visit (HOSPITAL_COMMUNITY)
Admission: RE | Admit: 2018-08-30 | Discharge: 2018-08-30 | Disposition: A | Discharge status: Home / Self Care | Payer: Federal, State, Local not specified - PPO | Attending: Otolaryngology | Admitting: Otolaryngology

## 2018-08-30 ENCOUNTER — Other Ambulatory Visit: Payer: Self-pay

## 2018-08-30 DIAGNOSIS — G4730 Sleep apnea, unspecified: Secondary | ICD-10-CM | POA: Diagnosis not present

## 2018-08-30 DIAGNOSIS — J3489 Other specified disorders of nose and nasal sinuses: Secondary | ICD-10-CM | POA: Diagnosis not present

## 2018-08-30 DIAGNOSIS — J338 Other polyp of sinus: Secondary | ICD-10-CM | POA: Diagnosis not present

## 2018-08-30 DIAGNOSIS — J321 Chronic frontal sinusitis: Secondary | ICD-10-CM | POA: Insufficient documentation

## 2018-08-30 DIAGNOSIS — Z6832 Body mass index (BMI) 32.0-32.9, adult: Secondary | ICD-10-CM | POA: Insufficient documentation

## 2018-08-30 DIAGNOSIS — M1990 Unspecified osteoarthritis, unspecified site: Secondary | ICD-10-CM | POA: Diagnosis not present

## 2018-08-30 DIAGNOSIS — R51 Headache: Secondary | ICD-10-CM | POA: Insufficient documentation

## 2018-08-30 DIAGNOSIS — J342 Deviated nasal septum: Secondary | ICD-10-CM | POA: Diagnosis not present

## 2018-08-30 DIAGNOSIS — J343 Hypertrophy of nasal turbinates: Secondary | ICD-10-CM | POA: Insufficient documentation

## 2018-08-30 DIAGNOSIS — E669 Obesity, unspecified: Secondary | ICD-10-CM | POA: Diagnosis not present

## 2018-08-30 DIAGNOSIS — Z87442 Personal history of urinary calculi: Secondary | ICD-10-CM | POA: Diagnosis not present

## 2018-08-30 DIAGNOSIS — E785 Hyperlipidemia, unspecified: Secondary | ICD-10-CM | POA: Diagnosis not present

## 2018-08-30 DIAGNOSIS — I4510 Unspecified right bundle-branch block: Secondary | ICD-10-CM | POA: Insufficient documentation

## 2018-08-30 DIAGNOSIS — J328 Other chronic sinusitis: Secondary | ICD-10-CM | POA: Diagnosis not present

## 2018-08-30 DIAGNOSIS — J320 Chronic maxillary sinusitis: Secondary | ICD-10-CM | POA: Diagnosis not present

## 2018-08-30 DIAGNOSIS — J322 Chronic ethmoidal sinusitis: Secondary | ICD-10-CM | POA: Diagnosis not present

## 2018-08-30 DIAGNOSIS — I2510 Atherosclerotic heart disease of native coronary artery without angina pectoris: Secondary | ICD-10-CM | POA: Diagnosis not present

## 2018-08-30 HISTORY — PX: ENDOSCOPIC CONCHA BULLOSA RESECTION: SHX6395

## 2018-08-30 HISTORY — PX: MAXILLARY ANTROSTOMY: SHX2003

## 2018-08-30 HISTORY — PX: NASAL SEPTOPLASTY W/ TURBINOPLASTY: SHX2070

## 2018-08-30 HISTORY — DX: Frequency of micturition: R35.0

## 2018-08-30 HISTORY — PX: SINUS ENDO WITH FUSION: SHX5329

## 2018-08-30 HISTORY — PX: ETHMOIDECTOMY: SHX5197

## 2018-08-30 SURGERY — SEPTOPLASTY, NOSE, WITH NASAL TURBINATE REDUCTION
Anesthesia: General | Site: Nose

## 2018-08-30 MED ORDER — LIDOCAINE HCL (CARDIAC) PF 100 MG/5ML IV SOSY
PREFILLED_SYRINGE | INTRAVENOUS | Status: DC | PRN
  Administered 2018-08-30: 80 mg via INTRAVENOUS

## 2018-08-30 MED ORDER — BACITRACIN ZINC 500 UNIT/GM EX OINT
TOPICAL_OINTMENT | CUTANEOUS | Status: AC
  Filled 2018-08-30: qty 28.35

## 2018-08-30 MED ORDER — OXYMETAZOLINE HCL 0.05 % NA SOLN
NASAL | Status: AC
  Filled 2018-08-30: qty 30

## 2018-08-30 MED ORDER — FENTANYL CITRATE (PF) 100 MCG/2ML IJ SOLN
INTRAMUSCULAR | Status: AC
  Filled 2018-08-30: qty 2

## 2018-08-30 MED ORDER — ONDANSETRON HCL 4 MG/2ML IJ SOLN
INTRAMUSCULAR | Status: DC | PRN
  Administered 2018-08-30: 4 mg via INTRAVENOUS

## 2018-08-30 MED ORDER — HYDROCODONE-ACETAMINOPHEN 7.5-325 MG PO TABS: 1.0000 | ORAL_TABLET | Freq: Once | ORAL | Status: DC | PRN

## 2018-08-30 MED ORDER — MUPIROCIN 2 % EX OINT
TOPICAL_OINTMENT | CUTANEOUS | Status: AC
  Filled 2018-08-30: qty 22

## 2018-08-30 MED ORDER — CEFAZOLIN SODIUM-DEXTROSE 2-3 GM-%(50ML) IV SOLR
INTRAVENOUS | Status: DC | PRN
  Administered 2018-08-30: 2 g via INTRAVENOUS

## 2018-08-30 MED ORDER — MIDAZOLAM HCL 2 MG/2ML IJ SOLN
INTRAMUSCULAR | Status: AC
  Filled 2018-08-30: qty 2

## 2018-08-30 MED ORDER — DEXAMETHASONE SODIUM PHOSPHATE 4 MG/ML IJ SOLN
INTRAMUSCULAR | Status: DC | PRN
  Administered 2018-08-30: 10 mg via INTRAVENOUS

## 2018-08-30 MED ORDER — LABETALOL HCL 5 MG/ML IV SOLN
5.0000 mg | Freq: Once | INTRAVENOUS | Status: AC
  Administered 2018-08-30: 5 mg via INTRAVENOUS

## 2018-08-30 MED ORDER — MEPERIDINE HCL 25 MG/ML IJ SOLN: 6.2500 mg | INTRAMUSCULAR | Status: DC | PRN

## 2018-08-30 MED ORDER — LIDOCAINE-EPINEPHRINE 1 %-1:100000 IJ SOLN
INTRAMUSCULAR | Status: DC | PRN
  Administered 2018-08-30: 2.5 mL

## 2018-08-30 MED ORDER — MUPIROCIN 2 % EX OINT
TOPICAL_OINTMENT | CUTANEOUS | Status: DC | PRN
  Administered 2018-08-30: 1 via NASAL

## 2018-08-30 MED ORDER — PROPOFOL 500 MG/50ML IV EMUL
INTRAVENOUS | Status: AC
  Filled 2018-08-30: qty 50

## 2018-08-30 MED ORDER — SUFENTANIL CITRATE 50 MCG/ML IV SOLN
INTRAVENOUS | Status: AC
  Filled 2018-08-30: qty 1

## 2018-08-30 MED ORDER — SUGAMMADEX SODIUM 500 MG/5ML IV SOLN
INTRAVENOUS | Status: DC | PRN
  Administered 2018-08-30: 250 mg via INTRAVENOUS

## 2018-08-30 MED ORDER — FENTANYL CITRATE (PF) 100 MCG/2ML IJ SOLN
50.0000 ug | INTRAMUSCULAR | Status: DC | PRN
  Administered 2018-08-30: 50 ug via INTRAVENOUS

## 2018-08-30 MED ORDER — OXYMETAZOLINE HCL 0.05 % NA SOLN
NASAL | Status: DC | PRN
  Administered 2018-08-30: 1 via TOPICAL

## 2018-08-30 MED ORDER — CEFAZOLIN SODIUM-DEXTROSE 2-4 GM/100ML-% IV SOLN
INTRAVENOUS | Status: AC
  Filled 2018-08-30: qty 100

## 2018-08-30 MED ORDER — PROPOFOL 10 MG/ML IV BOLUS
INTRAVENOUS | Status: DC | PRN
  Administered 2018-08-30: 200 mg via INTRAVENOUS

## 2018-08-30 MED ORDER — LIDOCAINE-EPINEPHRINE 1 %-1:100000 IJ SOLN
INTRAMUSCULAR | Status: AC
  Filled 2018-08-30: qty 1

## 2018-08-30 MED ORDER — SUFENTANIL CITRATE 50 MCG/ML IV SOLN
INTRAVENOUS | Status: DC | PRN
  Administered 2018-08-30: 20 ug via INTRAVENOUS

## 2018-08-30 MED ORDER — LACTATED RINGERS IV SOLN
INTRAVENOUS | Status: DC
  Administered 2018-08-30 (×3): via INTRAVENOUS

## 2018-08-30 MED ORDER — MIDAZOLAM HCL 2 MG/2ML IJ SOLN
1.0000 mg | INTRAMUSCULAR | Status: DC | PRN
  Administered 2018-08-30: 1 mg via INTRAVENOUS

## 2018-08-30 MED ORDER — SCOPOLAMINE 1 MG/3DAYS TD PT72: 1.0000 | MEDICATED_PATCH | Freq: Once | TRANSDERMAL | Status: DC | PRN

## 2018-08-30 MED ORDER — LABETALOL HCL 5 MG/ML IV SOLN
INTRAVENOUS | Status: AC
  Filled 2018-08-30: qty 4

## 2018-08-30 MED ORDER — ONDANSETRON HCL 4 MG/2ML IJ SOLN: 4.0000 mg | Freq: Once | INTRAMUSCULAR | Status: DC | PRN

## 2018-08-30 MED ORDER — FENTANYL CITRATE (PF) 100 MCG/2ML IJ SOLN: 25.0000 ug | INTRAMUSCULAR | Status: DC | PRN

## 2018-08-30 SURGICAL SUPPLY — 56 items
ATTRACTOMAT 16X20 MAGNETIC DRP (DRAPES) IMPLANT
BLADE RAD40 ROTATE 4M 4 5PK (BLADE) IMPLANT
BLADE RAD60 ROTATE M4 4 5PK (BLADE) IMPLANT
BLADE ROTATE RAD 12 4 M4 (BLADE) IMPLANT
BLADE ROTATE RAD 40 4 M4 (BLADE) IMPLANT
BLADE ROTATE TRICUT 4X13 M4 (BLADE) ×4 IMPLANT
BLADE TRICUT ROTATE M4 4 5PK (BLADE) IMPLANT
BUR HS RAD FRONTAL 3 (BURR) IMPLANT
CANISTER SUC SOCK COL 7IN (MISCELLANEOUS) ×8 IMPLANT
CANISTER SUCT 1200ML W/VALVE (MISCELLANEOUS) ×4 IMPLANT
COAGULATOR SUCT 8FR VV (MISCELLANEOUS) ×4 IMPLANT
COAGULATOR SUCT SWTCH 10FR 6 (ELECTROSURGICAL) IMPLANT
COVER WAND RF STERILE (DRAPES) IMPLANT
DECANTER SPIKE VIAL GLASS SM (MISCELLANEOUS) IMPLANT
DRSG NASAL KENNEDY LMNT 8CM (GAUZE/BANDAGES/DRESSINGS) IMPLANT
DRSG NASOPORE 8CM (GAUZE/BANDAGES/DRESSINGS) IMPLANT
DRSG TELFA 3X8 NADH (GAUZE/BANDAGES/DRESSINGS) IMPLANT
ELECT REM PT RETURN 9FT ADLT (ELECTROSURGICAL) ×4
ELECTRODE REM PT RTRN 9FT ADLT (ELECTROSURGICAL) ×3 IMPLANT
GLOVE BIO SURGEON STRL SZ7.5 (GLOVE) ×4 IMPLANT
GOWN STRL REUS W/ TWL LRG LVL3 (GOWN DISPOSABLE) ×6 IMPLANT
GOWN STRL REUS W/TWL LRG LVL3 (GOWN DISPOSABLE) ×2
HEMOSTAT SURGICEL 2X14 (HEMOSTASIS) IMPLANT
IV NS 1000ML (IV SOLUTION)
IV NS 1000ML BAXH (IV SOLUTION) IMPLANT
IV NS 500ML (IV SOLUTION) ×2
IV NS 500ML BAXH (IV SOLUTION) ×6 IMPLANT
IV SET EXT 30 76VOL 4 MALE LL (IV SETS) IMPLANT
NEEDLE HYPO 25X1 1.5 SAFETY (NEEDLE) ×4 IMPLANT
NEEDLE PRECISIONGLIDE 27X1.5 (NEEDLE) ×4 IMPLANT
NEEDLE SPNL 25GX3.5 QUINCKE BL (NEEDLE) IMPLANT
NS IRRIG 1000ML POUR BTL (IV SOLUTION) ×4 IMPLANT
PACK BASIN DAY SURGERY FS (CUSTOM PROCEDURE TRAY) ×4 IMPLANT
PACK ENT DAY SURGERY (CUSTOM PROCEDURE TRAY) ×4 IMPLANT
SLEEVE SCD COMPRESS KNEE MED (MISCELLANEOUS) IMPLANT
SOLUTION BUTLER CLEAR DIP (MISCELLANEOUS) ×8 IMPLANT
SPLINT NASAL AIRWAY SILICONE (MISCELLANEOUS) IMPLANT
SPONGE GAUZE 2X2 8PLY STRL LF (GAUZE/BANDAGES/DRESSINGS) ×4 IMPLANT
SPONGE NEURO XRAY DETECT 1X3 (DISPOSABLE) ×4 IMPLANT
SUCTION FRAZIER HANDLE 10FR (MISCELLANEOUS)
SUCTION TUBE FRAZIER 10FR DISP (MISCELLANEOUS) IMPLANT
SUT CHROMIC 4 0 P 3 18 (SUTURE) ×4 IMPLANT
SUT ETHILON 3 0 PS 1 (SUTURE) IMPLANT
SUT PLAIN 4 0 ~~LOC~~ 1 (SUTURE) ×4 IMPLANT
SUT PROLENE 3 0 PS 2 (SUTURE) ×4 IMPLANT
SUT VIC AB 4-0 P-3 18XBRD (SUTURE) IMPLANT
SUT VIC AB 4-0 P3 18 (SUTURE)
SYR 50ML LL SCALE MARK (SYRINGE) ×4 IMPLANT
TOWEL GREEN STERILE FF (TOWEL DISPOSABLE) ×4 IMPLANT
TRACKER ENT INSTRUMENT (MISCELLANEOUS) ×4 IMPLANT
TRACKER ENT PATIENT (MISCELLANEOUS) ×4 IMPLANT
TUBE CONNECTING 20X1/4 (TUBING) ×4 IMPLANT
TUBE SALEM SUMP 12R W/ARV (TUBING) IMPLANT
TUBE SALEM SUMP 16 FR W/ARV (TUBING) ×4 IMPLANT
TUBING STRAIGHTSHOT EPS 5PK (TUBING) ×4 IMPLANT
YANKAUER SUCT BULB TIP NO VENT (SUCTIONS) ×4 IMPLANT

## 2018-08-30 NOTE — Discharge Instructions (Addendum)

## 2018-08-30 NOTE — Op Note (Signed)
DATE OF PROCEDURE: 08/30/2018  OPERATIVE REPORT   SURGEON: Newman Pies, MD   PREOPERATIVE DIAGNOSES:  1. Severe nasal septal deviation.  2. Bilateral inferior turbinate hypertrophy.  3. Chronic nasal obstruction. 4. Chronic left maxillary, ethmoid, and frontal sinusitis and polyposis 5. Right conchal bullosa.  POSTOPERATIVE DIAGNOSES:  1. Severe nasal septal deviation.  2. Bilateral inferior turbinate hypertrophy.  3. Chronic nasal obstruction. 4. Chronic left maxillary, ethmoid, and frontal sinusitis and polyposis 5. Right conchal bullosa.  PROCEDURE PERFORMED:  1. Endoscopic left frontal sinusotomy and total ethmoidectomy with polyp removal. 2. Endoscopic left maxillary antrostomy with polyp removal. 3. Septoplasty.  4. Bilateral partial inferior turbinate resection.  5. Endoscopic right conchal bullosa resection. 6. FUSION stereotactic image guidance.   ANESTHESIA: General endotracheal tube anesthesia.   COMPLICATIONS: None.   ESTIMATED BLOOD LOSS: 150 mL.   INDICATION FOR PROCEDURE: Kevin Patton is a 63 y.o. male with a history of chronic rhinosinusitis, chronic nasal obstruction, and anosmia. On his CT scan, the patient was noted to have significant opacification of his left frontal, ethmoid, and maxillary sinuses. He was also noted to have severe nasal septal deviation, bilateral inferior turbinate hypertrophy, and a large right concha bullosa.The patient was previously treated with multiple doses of antibiotics, allergy medications, steroid nasal spray, and systemic steroids. However, the patient continued to be symptomatic.  Based on the above findings, the decision was made for the patient to undergo the above-stated procedures. The risks, benefits, alternatives, and details of the procedures were discussed with the patient. Questions were invited and answered. Informed consent was obtained.   DESCRIPTION OF PROCEDURE: The patient was taken to the operating room and placed  supine on the operating table. General endotracheal tube anesthesia was administered by the anesthesiologist. The patient was positioned, and prepped and draped in the standard fashion for nasal surgery. Pledgets soaked with Afrin were placed in both nasal cavities for decongestion. The pledgets were subsequently removed. The FUSION stereotactic image guidance marker was placed. The image guidance system was functional throughout the case.  Examination of the nasal cavity revealed a severe nasal septal deviationd. 1% lidocaine with 1:100,000 epinephrine was injected onto the nasal septum bilaterally. A hemitransfixion incision was made on the left side. The mucosal flap was carefully elevated on the left side. A cartilaginous incision was made 1 cm superior to the caudal margin of the nasal septum. Mucosal flap was also elevated on the right side in the similar fashion. It should be noted that due to the severe septal deviation, the deviated portion of the cartilaginous and bony septum had to be removed in piecemeal fashion. Once the deviated portions were removed, a straight midline septum was achieved. The septum was then quilted with 4-0 plain gut sutures. The hemitransfixion incision was closed with interrupted 4-0 chromic sutures.   The inferior one half of both hypertrophied inferior turbinate was crossclamped with a Kelly clamp. The inferior one half of each inferior turbinate was then resected with a pair of cross cutting scissors. Hemostasis was achieved with a suction cautery device.   Using a 0 endoscope, the right nasal cavity was examined. A large concha bullosa was noted. Using Tru-Cut forceps, the inferior one third and medial one half of the concha bullosa was resected. The remaining middle turbinate was medialized.  Attention was then focused on the left nasal cavity. Polypoid tissue was noted within the left middle meatus. The polypoid tissue was removed using a combination of microdebrider  and Blakesley forceps.  The uncinate process was resected with a freer elevator. The maxillary antrum was entered and enlarged using a combination of backbiter and microdebrider. Polypoid tissue was removed from the left maxillary sinus.  Attention was then focused on the ethmoid sinuses. The bony partitions of the anterior and posterior ethmoid cavities were taken down. Polypoid tissue was noted and removed. Attention was then focused on the frontal sinus. The frontal recess was identified and enlarged by removing the surrounding bony partitions. Polypoid tissue was removed from the frontal recess. All 3 paranasal sinuses were copiously irrigated with saline solution.  Doyle splints were applied to the nasal septum.  The care of the patient was turned over to the anesthesiologist. The patient was awakened from anesthesia without difficulty. The patient was extubated and transferred to the recovery room in good condition.   OPERATIVE FINDINGS: Severe nasal septal deviation and bilateral inferior turbinate hypertrophy. Right conchal bullosa. Chronic left maxillary, ethmoid, and frontal sinusitis, with polyposis.  SPECIMEN: Left sinus contents.   FOLLOWUP CARE: The patient be discharged home once he is awake and alert. The patient will be placed on Tylenol p.r.n. pain, and amoxicillin 875 mg p.o. b.i.d. for 5 days. The patient will follow up in my office in 3 days for splint removal.   Kevin Austria Philomena Doheny, MD

## 2018-08-30 NOTE — Anesthesia Postprocedure Evaluation (Signed)
Anesthesia Post Note  Patient: Kevin Patton  Procedure(s) Performed: NASAL SEPTOPLASTY WITH BILATERAL TURBINATE REDUCTION (Bilateral Nose) BILATERAL ENDOSCOPIC CONCHA BULLOSA RESECTION (Bilateral Nose) LEFT MAXILLARY ANTROSTOMY WITH TISSUE REMOVAL (Left Nose) LEFT TOTAL ETHMOIDECTOMY AND LEFT FRONTAL RECESS (Left Nose) SINUS ENDO WITH FUSION (N/A Nose)     Patient location during evaluation: PACU Anesthesia Type: General Level of consciousness: awake and alert and oriented Pain management: pain level controlled Vital Signs Assessment: post-procedure vital signs reviewed and stable Respiratory status: spontaneous breathing, nonlabored ventilation and respiratory function stable Cardiovascular status: blood pressure returned to baseline and stable Postop Assessment: no apparent nausea or vomiting Anesthetic complications: no    Last Vitals:  Vitals:   08/30/18 1130 08/30/18 1145  BP: (!) 159/102 (!) 150/94  Pulse: 83   Resp: 13   Temp:    SpO2: 100%     Last Pain:  Vitals:   08/30/18 1130  TempSrc:   PainSc: 3                  Victorina Kable A.

## 2018-08-30 NOTE — Anesthesia Procedure Notes (Signed)
Procedure Name: Intubation Date/Time: 08/30/2018 9:35 AM Performed by: Willa Frater, CRNA Pre-anesthesia Checklist: Patient identified, Emergency Drugs available, Suction available and Patient being monitored Patient Re-evaluated:Patient Re-evaluated prior to induction Oxygen Delivery Method: Circle system utilized Preoxygenation: Pre-oxygenation with 100% oxygen Induction Type: IV induction Ventilation: Mask ventilation without difficulty Laryngoscope Size: Mac and 4 Tube type: Oral Number of attempts: 1 Airway Equipment and Method: Stylet and Oral airway Placement Confirmation: ETT inserted through vocal cords under direct vision,  positive ETCO2 and breath sounds checked- equal and bilateral Secured at: 22 cm Tube secured with: Tape Dental Injury: Teeth and Oropharynx as per pre-operative assessment

## 2018-08-30 NOTE — Anesthesia Preprocedure Evaluation (Addendum)
Anesthesia Evaluation  Patient identified by MRN, date of birth, ID band Patient awake    Reviewed: Allergy & Precautions, NPO status , Patient's Chart, lab work & pertinent test results  Airway Mallampati: I  TM Distance: >3 FB Neck ROM: Full    Dental no notable dental hx. (+) Teeth Intact, Caps   Pulmonary sleep apnea and Continuous Positive Airway Pressure Ventilation ,  Right concha bullosa Deviated nasal septum Bilateral turbinate hypertrophy Maxillary, frontal, ethmoid sinusitis Recent runny nose and tickling in throat on Friday, placed on Amoxicillin by PCP- no fever, cough   Pulmonary exam normal breath sounds clear to auscultation       Cardiovascular + CAD  Normal cardiovascular exam Rhythm:Regular Rate:Normal  EKG 08/24/2018 NSR, incomplete RBBB+ LAFB, unchanged from 2019   Neuro/Psych  Headaches, negative psych ROS   GI/Hepatic Neg liver ROS, GERD  Medicated and Controlled,  Endo/Other  Hyperlipidemia Obesity  Renal/GU Hx/o renal calculi Bladder dysfunction      Musculoskeletal  (+) Arthritis , Osteoarthritis,    Abdominal (+) + obese,   Peds  Hematology negative hematology ROS (+)   Anesthesia Other Findings   Reproductive/Obstetrics                            Anesthesia Physical Anesthesia Plan  ASA: II  Anesthesia Plan: General   Post-op Pain Management:    Induction: Intravenous  PONV Risk Score and Plan: 4 or greater  Airway Management Planned: Oral ETT  Additional Equipment:   Intra-op Plan:   Post-operative Plan: Extubation in OR  Informed Consent: I have reviewed the patients History and Physical, chart, labs and discussed the procedure including the risks, benefits and alternatives for the proposed anesthesia with the patient or authorized representative who has indicated his/her understanding and acceptance.     Dental advisory given  Plan  Discussed with: CRNA and Surgeon  Anesthesia Plan Comments:         Anesthesia Quick Evaluation

## 2018-08-30 NOTE — H&P (Signed)
Cc: Chronic rhinosinusitis, chronic nasal obstruction, anosmia  HPI: The patient is a 64 year old male who returns today for his follow-up evaluation. The patient was last seen 1 month ago.  At that time, he was noted to have severe chronic rhinitis, with nasal mucosal congestion, severe nasal septal deviation and bilateral inferior turbinate hypertrophy.  The patient's history was suggestive of chronic rhinosinusitis.  He subsequently underwent a sinus CT scan.  The CT showed severe leftward nasal septal deviation, right concha bullosa, bilateral inferior turbinate hypertrophy, and near complete opacification of his left frontal, ethmoid and maxillary sinuses.  The patient returns today complaining of persistent nasal obstruction.  He still has no sense of smell or taste.  He has not responded to conservative medical treatment, including the daily use of Flonase nasal spray. No other ENT, GI, or respiratory issue noted since the last visit.   Exam General: Communicates without difficulty, well nourished, no acute distress. Head: Normocephalic, no evidence injury, no tenderness, facial buttresses intact without stepoff. Eyes: PERRL, EOMI. No scleral icterus, conjunctivae clear. Neuro: CN II exam reveals vision grossly intact.  No nystagmus at any point of gaze. Ears: Auricles well formed without lesions.  Ear canals are intact without mass or lesion.  No erythema or edema is appreciated.  The TMs are intact without fluid. Nose: External evaluation reveals normal support and skin without lesions.  Dorsum is intact.  Anterior rhinoscopy reveals congested and edematous mucosa over anterior aspect of the inferior turbinates and deviated nasal septum.  Oral:  Oral cavity and oropharynx are intact, symmetric, without erythema or edema.  Mucosa is moist without lesions. Neck: Full range of motion without pain.  There is no significant lymphadenopathy.  No masses palpable.  Thyroid bed within normal limits to  palpation.  Parotid glands and submandibular glands equal bilaterally without mass.  Trachea is midline. Neuro:  CN 2-12 grossly intact. Gait normal. Vestibular: No nystagmus at any point of gaze.   Assessment 1.  Chronic rhinosinusitis, involving the left frontal, ethmoid and maxillary sinuses.   2.  Severe septal deviation to the left.  3.  The patient also has a large right concha bullosa.  4.  Bilateral inferior turbinate hypertrophy.  5.  Chronic anosmia, secondary to his sinonasal disease.   Plan Print visit summary 1.  The physical exam findings and the CT images are extensively reviewed with the patient.  2.  The treatment options are also discussed. The options include continuing medical treatment versus surgical intervention with septoplasty, turbinate reduction, right concha bullosa resection, and left endosocpic sinus surgery.  The risks, benefits, alternatives and details of the procedures are reviewed with the patient.    3.  The patient would like to proceed with the procedures.

## 2018-08-30 NOTE — Transfer of Care (Signed)
Immediate Anesthesia Transfer of Care Note  Patient: Kevin Patton  Procedure(s) Performed: NASAL SEPTOPLASTY WITH BILATERAL TURBINATE REDUCTION (Bilateral Nose) BILATERAL ENDOSCOPIC CONCHA BULLOSA RESECTION (Bilateral Nose) LEFT MAXILLARY ANTROSTOMY WITH TISSUE REMOVAL (Left Nose) LEFT TOTAL ETHMOIDECTOMY AND LEFT FRONTAL RECESS (Left Nose) SINUS ENDO WITH FUSION (N/A Nose)  Patient Location: PACU  Anesthesia Type:General  Level of Consciousness: awake, alert  and oriented  Airway & Oxygen Therapy: Patient Spontanous Breathing and Patient connected to face mask oxygen  Post-op Assessment: Report given to RN and Post -op Vital signs reviewed and stable  Post vital signs: Reviewed and stable  Last Vitals:  Vitals Value Taken Time  BP    Temp    Pulse 86 08/30/2018 11:11 AM  Resp    SpO2 99 % 08/30/2018 11:11 AM  Vitals shown include unvalidated device data.  Last Pain:  Vitals:   08/30/18 0807  TempSrc: Oral  PainSc: 0-No pain         Complications: No apparent anesthesia complications

## 2018-08-31 ENCOUNTER — Encounter (HOSPITAL_BASED_OUTPATIENT_CLINIC_OR_DEPARTMENT_OTHER): Payer: Self-pay | Admitting: Otolaryngology

## 2018-09-02 ENCOUNTER — Ambulatory Visit (INDEPENDENT_AMBULATORY_CARE_PROVIDER_SITE_OTHER): Payer: Federal, State, Local not specified - PPO | Admitting: Otolaryngology

## 2018-09-02 DIAGNOSIS — J321 Chronic frontal sinusitis: Secondary | ICD-10-CM

## 2018-09-02 DIAGNOSIS — J338 Other polyp of sinus: Secondary | ICD-10-CM | POA: Diagnosis not present

## 2018-09-02 DIAGNOSIS — J320 Chronic maxillary sinusitis: Secondary | ICD-10-CM

## 2018-09-02 DIAGNOSIS — J322 Chronic ethmoidal sinusitis: Secondary | ICD-10-CM | POA: Diagnosis not present

## 2018-09-16 ENCOUNTER — Emergency Department (HOSPITAL_COMMUNITY)
Admission: EM | Admit: 2018-09-16 | Discharge: 2018-09-16 | Disposition: A | Discharge status: Home / Self Care | Payer: Federal, State, Local not specified - PPO | Attending: Emergency Medicine | Admitting: Emergency Medicine

## 2018-09-16 ENCOUNTER — Encounter (HOSPITAL_COMMUNITY): Payer: Self-pay

## 2018-09-16 ENCOUNTER — Ambulatory Visit (INDEPENDENT_AMBULATORY_CARE_PROVIDER_SITE_OTHER): Payer: Federal, State, Local not specified - PPO | Admitting: Otolaryngology

## 2018-09-16 ENCOUNTER — Emergency Department (HOSPITAL_COMMUNITY): Payer: Federal, State, Local not specified - PPO

## 2018-09-16 ENCOUNTER — Other Ambulatory Visit: Payer: Self-pay

## 2018-09-16 DIAGNOSIS — N1330 Unspecified hydronephrosis: Secondary | ICD-10-CM | POA: Diagnosis not present

## 2018-09-16 DIAGNOSIS — R109 Unspecified abdominal pain: Secondary | ICD-10-CM

## 2018-09-16 DIAGNOSIS — N201 Calculus of ureter: Secondary | ICD-10-CM | POA: Insufficient documentation

## 2018-09-16 DIAGNOSIS — Z79899 Other long term (current) drug therapy: Secondary | ICD-10-CM | POA: Diagnosis not present

## 2018-09-16 DIAGNOSIS — I2510 Atherosclerotic heart disease of native coronary artery without angina pectoris: Secondary | ICD-10-CM | POA: Diagnosis not present

## 2018-09-16 LAB — URINALYSIS, ROUTINE W REFLEX MICROSCOPIC
Bacteria, UA: NONE SEEN
Bilirubin Urine: NEGATIVE
GLUCOSE, UA: NEGATIVE mg/dL
Ketones, ur: NEGATIVE mg/dL
Leukocytes,Ua: NEGATIVE
Nitrite: NEGATIVE
PH: 5 (ref 5.0–8.0)
Protein, ur: NEGATIVE mg/dL
Specific Gravity, Urine: 1.011 (ref 1.005–1.030)

## 2018-09-16 LAB — CBC
HCT: 42.2 % (ref 39.0–52.0)
Hemoglobin: 14.3 g/dL (ref 13.0–17.0)
MCH: 31.6 pg (ref 26.0–34.0)
MCHC: 33.9 g/dL (ref 30.0–36.0)
MCV: 93.2 fL (ref 80.0–100.0)
Platelets: 201 10*3/uL (ref 150–400)
RBC: 4.53 MIL/uL (ref 4.22–5.81)
RDW: 12.3 % (ref 11.5–15.5)
WBC: 6.4 10*3/uL (ref 4.0–10.5)
nRBC: 0 % (ref 0.0–0.2)

## 2018-09-16 LAB — BASIC METABOLIC PANEL
Anion gap: 7 (ref 5–15)
BUN: 21 mg/dL (ref 8–23)
CO2: 24 mmol/L (ref 22–32)
Calcium: 8.3 mg/dL — ABNORMAL LOW (ref 8.9–10.3)
Chloride: 109 mmol/L (ref 98–111)
Creatinine, Ser: 0.99 mg/dL (ref 0.61–1.24)
GFR calc Af Amer: >60 mL/min (ref >60)
GFR calc non Af Amer: >60 mL/min (ref >60)
Glucose, Bld: 108 mg/dL — ABNORMAL HIGH (ref 70–99)
Potassium: 3.9 mmol/L (ref 3.5–5.1)
SODIUM: 140 mmol/L (ref 135–145)

## 2018-09-16 MED ORDER — SODIUM CHLORIDE 0.9 % IV SOLN
INTRAVENOUS | Status: DC
  Administered 2018-09-16: 21:00:00 via INTRAVENOUS

## 2018-09-16 MED ORDER — SODIUM CHLORIDE 0.9 % IV BOLUS
500.0000 mL | Freq: Once | INTRAVENOUS | Status: AC
  Administered 2018-09-16: 500 mL via INTRAVENOUS

## 2018-09-16 MED ORDER — ONDANSETRON HCL 4 MG/2ML IJ SOLN
4.0000 mg | Freq: Once | INTRAMUSCULAR | Status: AC
  Administered 2018-09-16: 4 mg via INTRAVENOUS
  Filled 2018-09-16: qty 2

## 2018-09-16 MED ORDER — HYDROCODONE-ACETAMINOPHEN 5-325 MG PO TABS: 1.0000 | ORAL_TABLET | Freq: Four times a day (QID) | ORAL | 0 refills | Status: AC | PRN

## 2018-09-16 MED ORDER — ONDANSETRON 4 MG PO TBDP: 4.0000 mg | ORAL_TABLET | Freq: Three times a day (TID) | ORAL | 1 refills | Status: AC | PRN

## 2018-09-16 NOTE — ED Provider Notes (Addendum)
[facility] EMERGENCY DEPARTMENT Provider Note   CSN: [id number] Arrival date & time: 09/16/18  1520    History   Chief Complaint Chief Complaint  Patient presents with  . Flank Pain    HPI Kevin Patton is a 63 y.o. male.     Patient with onset of left CVA left flank pain at 9 this morning.  Patient without any history of kidney stones.  Pain started at 1 PM today.  The pain comes and goes very intense when it is there and then it goes completely away.  Does get some nausea episodes.  But has not vomited.  No pain with urination no blood with urination.  No fevers.  No upper respiratory symptoms.  Or flulike symptoms.     Past Medical History:  Diagnosis Date  . Arthritis   . Bowel obstruction (HCC) 07/2017   partial  . GERD (gastroesophageal reflux disease)   . Headache   . History of kidney stones   . Sleep apnea    use bipap every night  . Urinary frequency     Patient Active Problem List   Diagnosis Date Noted  . Norovirus   . E coli enteritis   . Partial small bowel obstruction (HCC) 08/23/2017  . Migraine syndrome 08/23/2017  . PALPITATIONS 12/11/2008  . CHEST PAIN-UNSPECIFIED 12/11/2008  . Hyperlipidemia 11/15/2007  . CORONARY ARTERY DISEASE 11/15/2007  . SLEEP APNEA 11/15/2007  . HEADACHE, CHRONIC 11/15/2007    Past Surgical History:  Procedure Laterality Date  . CYST EXCISION     Pilondial cyst  . ENDOSCOPIC CONCHA BULLOSA RESECTION Bilateral 08/30/2018   Procedure: BILATERAL ENDOSCOPIC CONCHA BULLOSA RESECTION;  Surgeon: Newman Pies, MD;  Location: [facility];  Service: ENT;  Laterality: Bilateral;  . ETHMOIDECTOMY Left 08/30/2018   Procedure: LEFT TOTAL ETHMOIDECTOMY AND LEFT FRONTAL RECESS;  Surgeon: Newman Pies, MD;  Location: [facility];  Service: ENT;  Laterality: Left;  . HERNIA REPAIR     umbilical  . MAXILLARY ANTROSTOMY Left 08/30/2018   Procedure: LEFT MAXILLARY ANTROSTOMY WITH TISSUE REMOVAL;  Surgeon: Newman Pies, MD;  Location: [facility];  Service: ENT;  Laterality: Left;  . NASAL SEPTOPLASTY W/ TURBINOPLASTY Bilateral 08/30/2018   Procedure: NASAL SEPTOPLASTY WITH BILATERAL TURBINATE REDUCTION;  Surgeon: Newman Pies, MD;  Location: [facility];  Service: ENT;  Laterality: Bilateral;  . SINUS ENDO WITH FUSION N/A 08/30/2018   Procedure: SINUS ENDO WITH FUSION;  Surgeon: Newman Pies, MD;  Location: [facility];  Service: ENT;  Laterality: N/A;        Home Medications    Prior to Admission medications   Medication Sig Start Date End Date Taking? Authorizing Provider  amoxicillin-clavulanate (AUGMENTIN) 875-125 MG tablet Take 1 tablet by mouth 2 (two) times daily.    [provider]  DICLOFENAC SODIUM PO Take 75 mg by mouth 2 (two) times daily.    [provider]  HYDROcodone-acetaminophen (NORCO/VICODIN) 5-325 MG tablet Take 1 tablet by mouth every 6 (six) hours as needed. 09/16/18   Vanetta Mulders, MD  Multiple Vitamin (MULTIVITAMIN WITH MINERALS) TABS tablet Take 1 tablet by mouth at bedtime. Men's Multi Vitamin    [provider]  ondansetron (ZOFRAN ODT) 4 MG disintegrating tablet Take 1 tablet (4 mg total) by mouth every 8 (eight) hours as needed. 09/16/18   Vanetta Mulders, MD  predniSONE (DELTASONE) 5 MG tablet Take 5 mg by mouth daily with breakfast.  [provider]  PRESCRIPTION MEDICATION Inhale into the lungs at bedtime. BIPAP    [provider]  simvastatin (ZOCOR) 20 MG tablet Take 20 mg by mouth at bedtime. 05/18/17   [provider]  zonisamide (ZONEGRAN) 50 MG capsule Take 150 mg by mouth at bedtime. 08/03/17   [provider]    Family History No family history on file.  Social History Social History   Tobacco Use  . Smoking status: Never Smoker  . Smokeless tobacco: Never Used  Substance Use Topics  . Alcohol use: No    Frequency: Never  . Drug use: No     Allergies    Patient has no known allergies.   Review of Systems Review of Systems  Constitutional: Negative for chills and fever.  HENT: Negative for rhinorrhea and sore throat.   Eyes: Negative for visual disturbance.  Respiratory: Negative for cough and shortness of breath.   Cardiovascular: Negative for chest pain and leg swelling.  Gastrointestinal: Negative for abdominal pain, diarrhea, nausea and vomiting.  Genitourinary: Positive for flank pain. Negative for dysuria and hematuria.  Musculoskeletal: Positive for back pain. Negative for neck pain.  Skin: Negative for rash.  Neurological: Negative for dizziness, light-headedness and headaches.  Hematological: Does not bruise/bleed easily.  Psychiatric/Behavioral: Negative for confusion.     Physical Exam Updated Vital Signs BP 137/83   Pulse 65   Temp 98.1 F (36.7 C) (Oral)   Resp 17   Ht 1.88 m (6\' 2" )   Wt 113.4 kg   SpO2 98%   BMI 32.10 kg/m   Physical Exam Vitals signs and nursing note reviewed.  Constitutional:      General: He is not in acute distress.    Appearance: He is well-developed.  HENT:     Head: Normocephalic and atraumatic.     Mouth/Throat:     Mouth: Mucous membranes are moist.  Eyes:     Extraocular Movements: Extraocular movements intact.     Conjunctiva/sclera: Conjunctivae normal.     Pupils: Pupils are equal, round, and reactive to light.  Neck:     Musculoskeletal: Normal range of motion and neck supple.  Cardiovascular:     Rate and Rhythm: Normal rate and regular rhythm.     Heart sounds: No murmur.  Pulmonary:     Effort: Pulmonary effort is normal. No respiratory distress.     Breath sounds: Normal breath sounds.  Abdominal:     Palpations: Abdomen is soft.     Tenderness: There is no abdominal tenderness.  Musculoskeletal: Normal range of motion.     Comments: No CVA tenderness.  Skin:    General: Skin is warm and dry.  Neurological:     General: No focal deficit present.      Mental Status: He is alert and oriented to person, place, and time.      ED Treatments / Results  Labs (all labs ordered are listed, but only abnormal results are displayed) Labs Reviewed  CBC  URINALYSIS, ROUTINE W REFLEX MICROSCOPIC  BASIC METABOLIC PANEL    EKG None  Radiology Ct Renal Stone Study  Result Date: 09/16/2018 CLINICAL DATA:  Left flank pain beginning at 8 a.m. this morning. EXAM: CT ABDOMEN AND PELVIS WITHOUT CONTRAST TECHNIQUE: Multidetector CT imaging of the abdomen and pelvis was performed following the standard protocol without IV contrast. COMPARISON:  08/23/2017 FINDINGS: Lower chest: Small nodule, lateral right lower lobe, stable from the prior exam. Lung bases otherwise clear. Hepatobiliary:  13 mm cyst, left lobe, stable. No other liver abnormality. Normal gallbladder. No bile duct dilation. Pancreas: Unremarkable. No pancreatic ductal dilatation or surrounding inflammatory changes. Spleen: Normal in size without focal abnormality. Adrenals/Urinary Tract: No adrenal masses. Mild left hydronephrosis, which is due to a 5 mm stone in the proximal left ureter. There is associated mild left perinephric stranding. No other left ureteral stone. 2-3 mm nonobstructing stone mid to upper pole of the right kidney. No other intrarenal stones. 3.3 cm low-density mass, upper pole the right kidney. 1.6 cm low-density mass midpole of the left kidney. 3.4 cm exophytic low-density mass, lower pole the left kidney. These are consistent with cysts and stable from the prior study. No other renal masses. Normal right ureter.  Bladder is unremarkable. Stomach/Bowel: Stomach is within normal limits. Appendix appears normal. No evidence of bowel wall thickening, distention, or inflammatory changes. Vascular/Lymphatic: No significant vascular findings are present. No enlarged abdominal or pelvic lymph nodes. Reproductive: Unremarkable. Other: No abdominal wall hernia or abnormality. No  abdominopelvic ascites. Musculoskeletal: No fracture or acute finding. No osteoblastic or osteolytic lesions. IMPRESSION: 1. 5 mm stone in the proximal left ureter causes mild left hydronephrosis and left perinephric stranding. 2. No other acute abnormality. 3. Small nonobstructing stone in the right kidney. Bilateral renal cysts. Small liver cyst. Electronically Signed   By: Amie Portland M.D.   On: 09/16/2018 20:12    Procedures Procedures (including critical care time)  Medications Ordered in ED Medications  0.9 %  sodium chloride infusion ( Intravenous New Bag/Given 09/16/18 2037)  sodium chloride 0.9 % bolus 500 mL (500 mLs Intravenous New Bag/Given 09/16/18 2036)  ondansetron (ZOFRAN) injection 4 mg (4 mg Intravenous Given 09/16/18 2034)     Initial Impression / Assessment and Plan / ED Course  I have reviewed the triage vital signs and the nursing notes.  Pertinent labs & imaging results that were available during my care of the patient were reviewed by me and considered in my medical decision making (see chart for details).        CT scan consistent with a proximal left ureteral stone.  Measuring 5 mm.  Patient will be given referral to urology to call and set up a follow-up appointment.  Patient will call tomorrow to set that up.  Patient given precautions on what to return for.  Labs without any significant abnormalities.  Renal function was normal.  Urinalysis never collected.  Will not have to wait for that.  Patient without any signs of any infection.-  Final Clinical Impressions(s) / ED Diagnoses   Final diagnoses:  Left flank pain  Left ureteral stone    ED Discharge Orders         Ordered    HYDROcodone-acetaminophen (NORCO/VICODIN) 5-325 MG tablet  Every 6 hours PRN     09/16/18 2136    ondansetron (ZOFRAN ODT) 4 MG disintegrating tablet  Every 8 hours PRN     09/16/18 2137           Vanetta Mulders, MD 09/16/18 2152    Vanetta Mulders, MD 09/16/18  2155

## 2018-09-16 NOTE — Discharge Instructions (Signed)
Make an appointment to follow-up with urology call them to set it up tomorrow.  Take pain medication as needed.  Take the antinausea medicine which was sent to the pharmacy here in [place] for pickup tomorrow as needed.  Prepack of pain medicine provided here for tonight.  Return for any new or worse symptoms.

## 2018-09-16 NOTE — ED Triage Notes (Signed)
Pt c/o left back pain that started about 1300 today. Patient states that when pain hits he feels nauseous. Pt states that pain comes and goes 3x today. Pt has no pain or blood while urinating.

## 2018-09-16 NOTE — ED Notes (Signed)
Patient transported to CT 

## 2018-09-17 DIAGNOSIS — N5201 Erectile dysfunction due to arterial insufficiency: Secondary | ICD-10-CM | POA: Diagnosis not present

## 2018-09-17 DIAGNOSIS — J320 Chronic maxillary sinusitis: Secondary | ICD-10-CM | POA: Diagnosis not present

## 2018-09-17 DIAGNOSIS — R350 Frequency of micturition: Secondary | ICD-10-CM | POA: Diagnosis not present

## 2018-09-17 DIAGNOSIS — J338 Other polyp of sinus: Secondary | ICD-10-CM | POA: Diagnosis not present

## 2018-09-17 DIAGNOSIS — J322 Chronic ethmoidal sinusitis: Secondary | ICD-10-CM | POA: Diagnosis not present

## 2018-09-17 DIAGNOSIS — N202 Calculus of kidney with calculus of ureter: Secondary | ICD-10-CM | POA: Diagnosis not present

## 2018-09-17 DIAGNOSIS — J321 Chronic frontal sinusitis: Secondary | ICD-10-CM | POA: Diagnosis not present

## 2018-09-17 DIAGNOSIS — N401 Enlarged prostate with lower urinary tract symptoms: Secondary | ICD-10-CM | POA: Diagnosis not present

## 2018-09-17 MED FILL — Hydrocodone-Acetaminophen Tab 5-325 MG: ORAL | Qty: 6 | Status: AC

## 2018-10-14 DIAGNOSIS — N200 Calculus of kidney: Secondary | ICD-10-CM | POA: Diagnosis not present

## 2018-10-14 DIAGNOSIS — N5201 Erectile dysfunction due to arterial insufficiency: Secondary | ICD-10-CM | POA: Diagnosis not present

## 2018-10-14 DIAGNOSIS — N281 Cyst of kidney, acquired: Secondary | ICD-10-CM | POA: Diagnosis not present

## 2018-10-15 DIAGNOSIS — J322 Chronic ethmoidal sinusitis: Secondary | ICD-10-CM | POA: Diagnosis not present

## 2018-10-15 DIAGNOSIS — J338 Other polyp of sinus: Secondary | ICD-10-CM | POA: Diagnosis not present

## 2018-10-15 DIAGNOSIS — J321 Chronic frontal sinusitis: Secondary | ICD-10-CM | POA: Diagnosis not present

## 2018-10-15 DIAGNOSIS — J320 Chronic maxillary sinusitis: Secondary | ICD-10-CM | POA: Diagnosis not present

## 2019-03-03 DIAGNOSIS — Z6833 Body mass index (BMI) 33.0-33.9, adult: Secondary | ICD-10-CM | POA: Diagnosis not present

## 2019-03-03 DIAGNOSIS — E7849 Other hyperlipidemia: Secondary | ICD-10-CM | POA: Diagnosis not present

## 2019-03-03 DIAGNOSIS — G4730 Sleep apnea, unspecified: Secondary | ICD-10-CM | POA: Diagnosis not present

## 2019-03-03 DIAGNOSIS — E6609 Other obesity due to excess calories: Secondary | ICD-10-CM | POA: Diagnosis not present

## 2019-03-17 DIAGNOSIS — G43719 Chronic migraine without aura, intractable, without status migrainosus: Secondary | ICD-10-CM | POA: Diagnosis not present

## 2019-03-28 ENCOUNTER — Other Ambulatory Visit: Payer: Self-pay | Admitting: *Deleted

## 2019-03-28 DIAGNOSIS — Z20822 Contact with and (suspected) exposure to covid-19: Secondary | ICD-10-CM

## 2019-03-28 DIAGNOSIS — R6889 Other general symptoms and signs: Secondary | ICD-10-CM | POA: Diagnosis not present

## 2019-03-29 LAB — NOVEL CORONAVIRUS, NAA: SARS-CoV-2, NAA: NOT DETECTED

## 2019-04-11 ENCOUNTER — Ambulatory Visit (INDEPENDENT_AMBULATORY_CARE_PROVIDER_SITE_OTHER): Payer: Federal, State, Local not specified - PPO | Admitting: Otolaryngology

## 2019-04-14 DIAGNOSIS — N5201 Erectile dysfunction due to arterial insufficiency: Secondary | ICD-10-CM | POA: Diagnosis not present

## 2019-04-14 DIAGNOSIS — N200 Calculus of kidney: Secondary | ICD-10-CM | POA: Diagnosis not present

## 2019-04-14 DIAGNOSIS — R350 Frequency of micturition: Secondary | ICD-10-CM | POA: Diagnosis not present

## 2019-04-14 DIAGNOSIS — N401 Enlarged prostate with lower urinary tract symptoms: Secondary | ICD-10-CM | POA: Diagnosis not present

## 2019-04-19 DIAGNOSIS — Z23 Encounter for immunization: Secondary | ICD-10-CM | POA: Diagnosis not present

## 2019-04-19 DIAGNOSIS — Z0000 Encounter for general adult medical examination without abnormal findings: Secondary | ICD-10-CM | POA: Diagnosis not present

## 2019-04-19 DIAGNOSIS — R5383 Other fatigue: Secondary | ICD-10-CM | POA: Diagnosis not present

## 2019-04-19 DIAGNOSIS — Z6833 Body mass index (BMI) 33.0-33.9, adult: Secondary | ICD-10-CM | POA: Diagnosis not present

## 2019-04-19 DIAGNOSIS — E7849 Other hyperlipidemia: Secondary | ICD-10-CM | POA: Diagnosis not present

## 2019-04-19 DIAGNOSIS — Z0001 Encounter for general adult medical examination with abnormal findings: Secondary | ICD-10-CM | POA: Diagnosis not present

## 2019-04-20 DIAGNOSIS — Z23 Encounter for immunization: Secondary | ICD-10-CM | POA: Diagnosis not present

## 2019-04-20 DIAGNOSIS — R5383 Other fatigue: Secondary | ICD-10-CM | POA: Diagnosis not present

## 2019-04-20 DIAGNOSIS — Z0000 Encounter for general adult medical examination without abnormal findings: Secondary | ICD-10-CM | POA: Diagnosis not present

## 2019-04-20 DIAGNOSIS — Z0001 Encounter for general adult medical examination with abnormal findings: Secondary | ICD-10-CM | POA: Diagnosis not present

## 2019-05-09 ENCOUNTER — Ambulatory Visit (INDEPENDENT_AMBULATORY_CARE_PROVIDER_SITE_OTHER): Payer: Federal, State, Local not specified - PPO | Admitting: Otolaryngology

## 2019-05-09 ENCOUNTER — Other Ambulatory Visit: Payer: Self-pay

## 2019-05-09 DIAGNOSIS — J322 Chronic ethmoidal sinusitis: Secondary | ICD-10-CM | POA: Diagnosis not present

## 2019-05-09 DIAGNOSIS — J320 Chronic maxillary sinusitis: Secondary | ICD-10-CM | POA: Diagnosis not present

## 2019-05-09 DIAGNOSIS — J321 Chronic frontal sinusitis: Secondary | ICD-10-CM | POA: Diagnosis not present

## 2019-07-27 DIAGNOSIS — G4733 Obstructive sleep apnea (adult) (pediatric): Secondary | ICD-10-CM | POA: Diagnosis not present

## 2019-09-15 DIAGNOSIS — G43719 Chronic migraine without aura, intractable, without status migrainosus: Secondary | ICD-10-CM | POA: Diagnosis not present

## 2019-09-15 DIAGNOSIS — Z23 Encounter for immunization: Secondary | ICD-10-CM | POA: Diagnosis not present

## 2019-10-14 DIAGNOSIS — Z23 Encounter for immunization: Secondary | ICD-10-CM | POA: Diagnosis not present

## 2020-01-24 IMAGING — CT CT MAXILLOFACIAL W/O CM
3 series · 15 of 47 positions shown, 18 images · non-contrast
Comparison: None.

CLINICAL DATA: Decreased air flow and decreased sense of smell.
Chronic maxillary sinusitis.

EXAM:
CT MAXILLOFACIAL WITHOUT CONTRAST
TECHNIQUE: Multidetector CT images of the paranasal sinuses were obtained using
the standard protocol without intravenous contrast.

[Series 2: standard · axial · 0.38mm/px · z∈[+1197,+1359]mm · 9 of 188 slices shown, 12 images]
[im 13/188  brain]
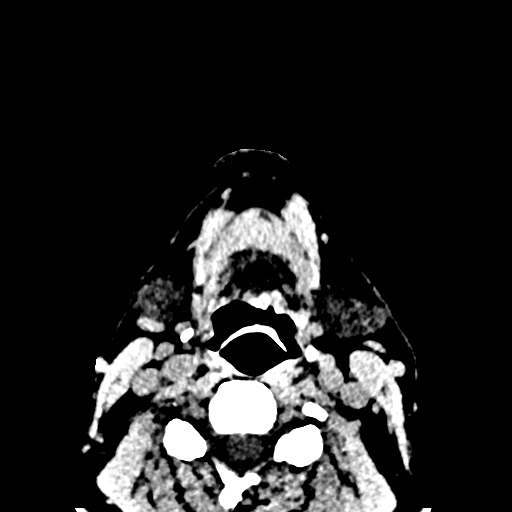
[im 13/188  bone]
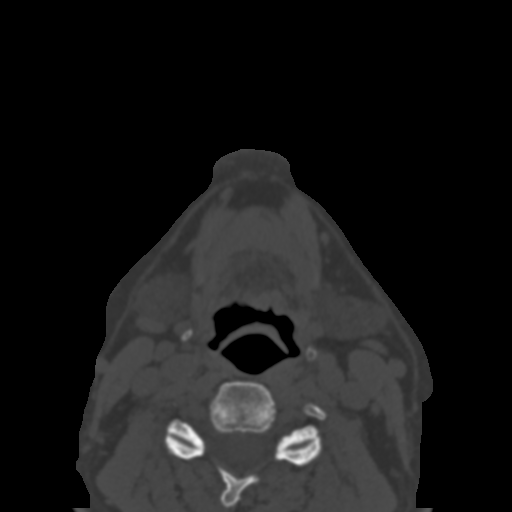
[im 33/188  bone]
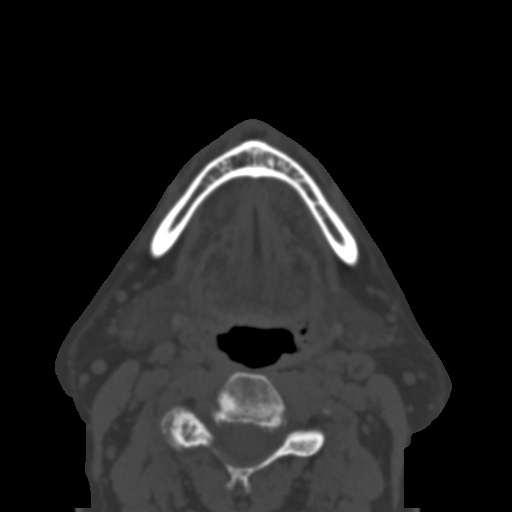
[im 52/188  bone]
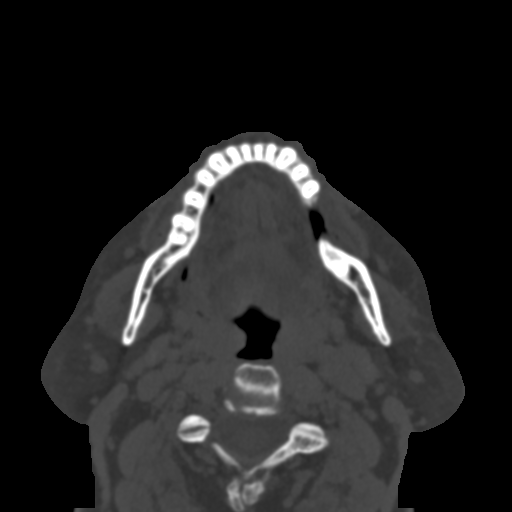
[im 71/188  bone]
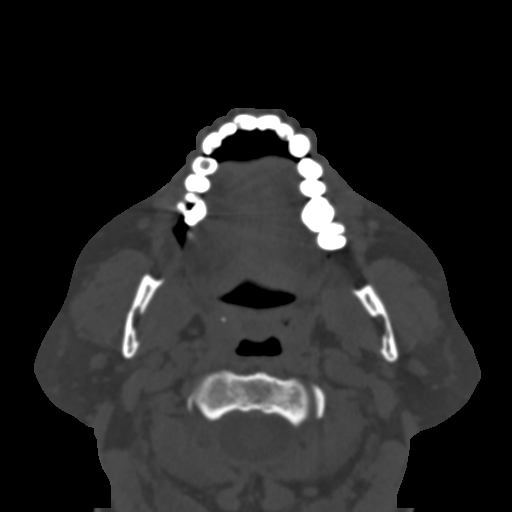
[im 97/188  brain]
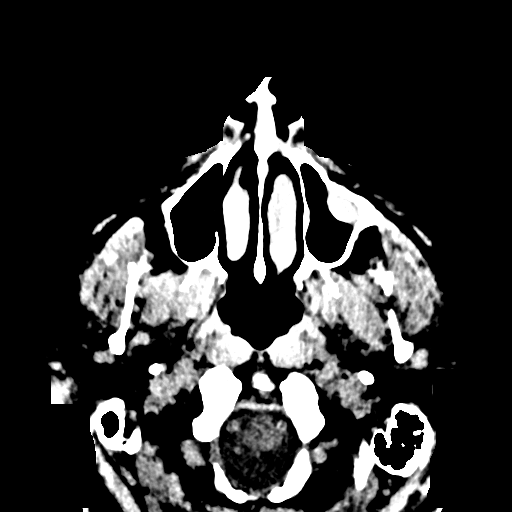
[im 97/188  bone]
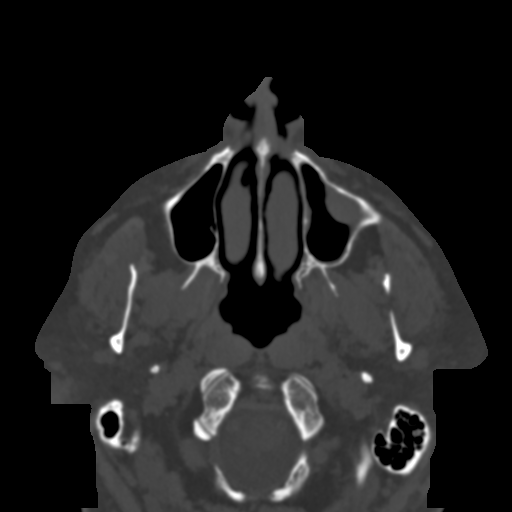
[im 117/188  bone]
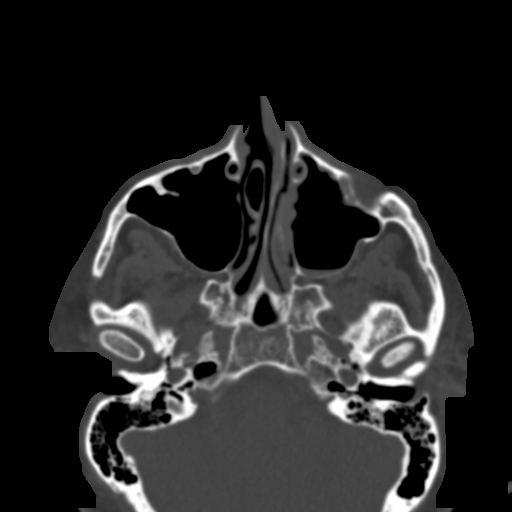
[im 136/188  bone]
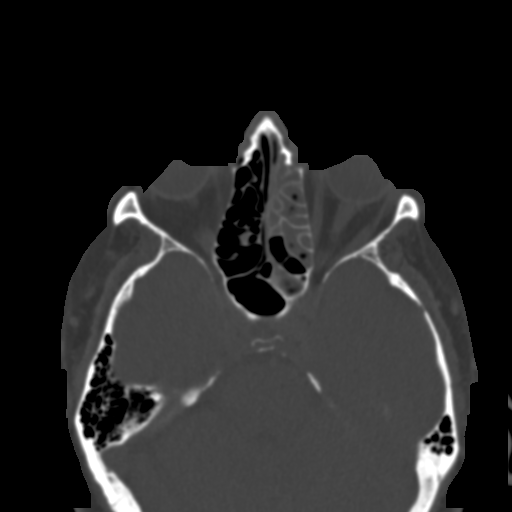
[im 155/188  bone]
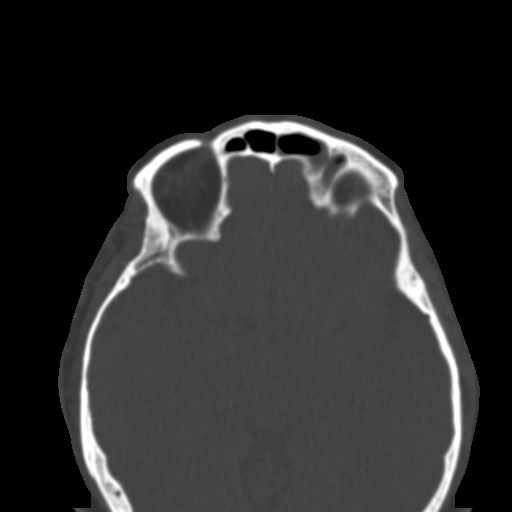
[im 175/188  brain]
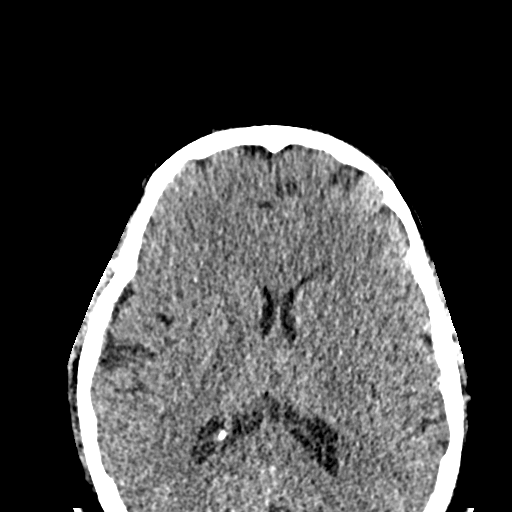
[im 175/188  bone]
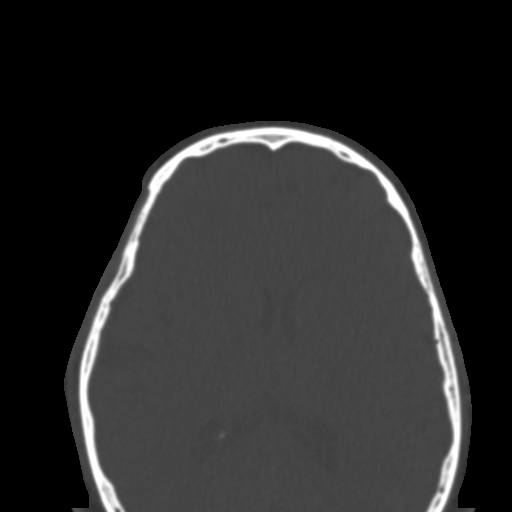

[Series 4: coronal · coronal · 0.39mm/px · 3 of 80 slices shown]
[im 27/80  bone]
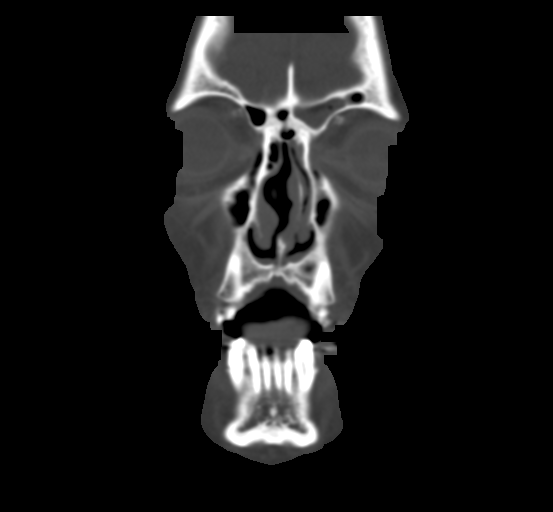
[im 36/80  bone]
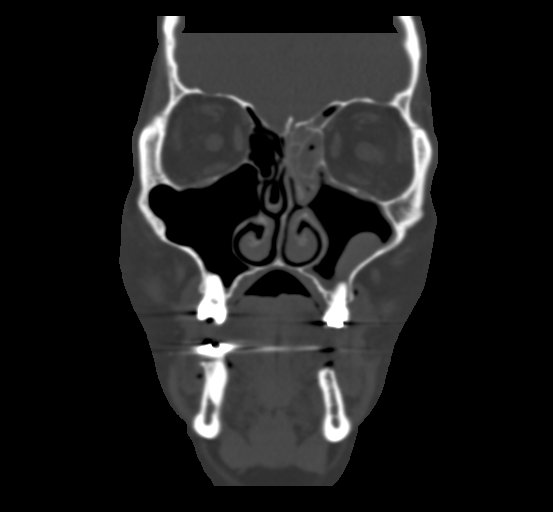
[im 44/80  bone]
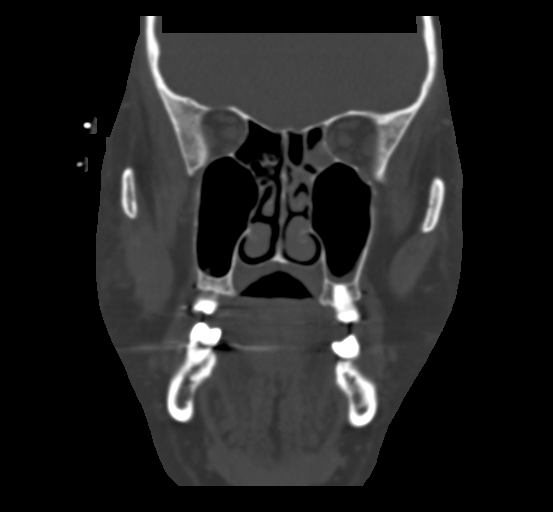

[Series 5: sagittal · sagittal · 0.34mm/px · 3 of 101 slices shown]
[im 34/101  bone]
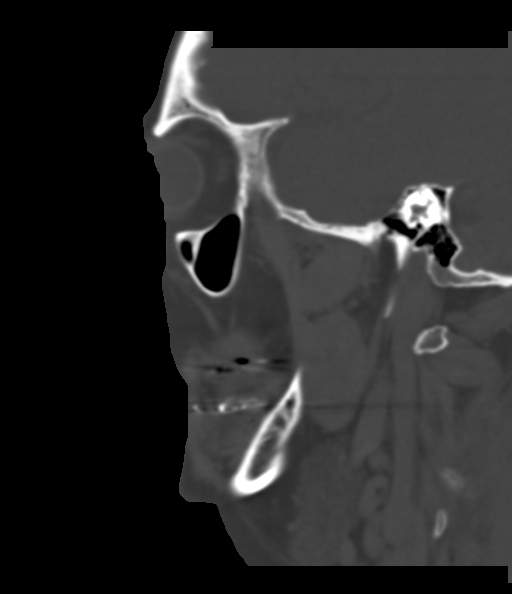
[im 51/101  bone]
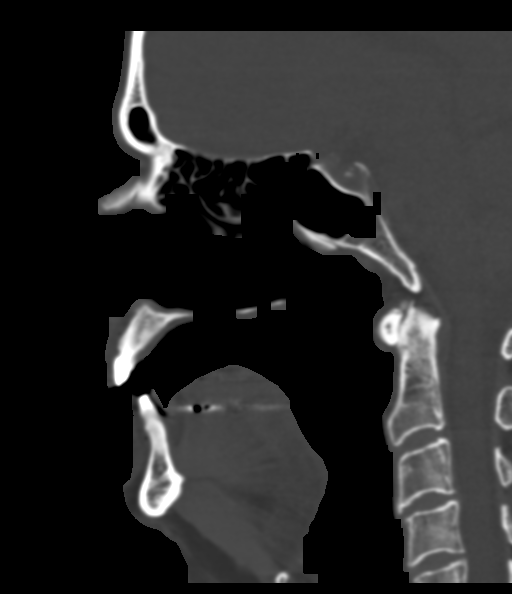
[im 67/101  bone]
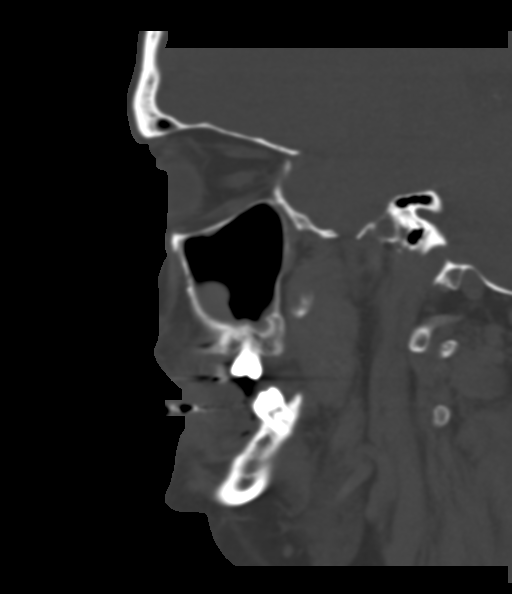

[15 of 47 positions shown; findings below may reference images not displayed]

FINDINGS: Paranasal sinuses:

Frontal: RIGHT-sided normal. LEFT frontal opacity and mucosal
thickening, with air-fluid level.

Ethmoid: RIGHT-sided centrally normal. Near complete opacification
of anterior and middle ethmoid air cells on the LEFT.

Maxillary: Normal aerated on the RIGHT. Mucosal thickening and
retention cyst formation on the LEFT, with slight layering fluid.

Sphenoid: Dominant LEFT division is clear. Mild mucosal thickening
of the smaller RIGHT division and adjacent posterior ethmoid air
cells.

Right ostiomeatal unit: Narrowed but patent.

Left ostiomeatal unit: Completely obstructed.

Nasal passages: Asymmetrically narrowed LEFT nasal cavity due to
turbinate hypertrophy and mucosal thickening. Nasal septal deviation
RIGHT-to-LEFT, 3 mm. RIGHT middle turbinate concha bullosa. Nasal
deformity, possible old fracture, with significant narrowing of the
anterior nasal passage.

Anatomy: Slight frontal sinus pneumatization superior to the
anterior ethmoid notches. Symmetric and intact olfactory grooves and
fovea ethmoidalis, Keros I (1-3mm). Sellar sphenoid pneumatization
pattern.

Other: Orbits and intracranial compartment are unremarkable. Visible
mastoid air cells are normally aerated.
IMPRESSION: 1. Chronic and acute LEFT frontal, ethmoid, and maxillary sinusitis.
2. Obstructed LEFT ostiomeatal unit.
3. Nasal septal deviation RIGHT to LEFT 3 mm. RIGHT middle turbinate
concha bullosa.
4. Narrowing of the anterior nasal passage on the LEFT. Possible old
nasal fracture.

## 2020-04-04 DIAGNOSIS — G43719 Chronic migraine without aura, intractable, without status migrainosus: Secondary | ICD-10-CM | POA: Diagnosis not present

## 2020-05-29 DIAGNOSIS — G4489 Other headache syndrome: Secondary | ICD-10-CM | POA: Diagnosis not present

## 2020-05-29 DIAGNOSIS — Z1389 Encounter for screening for other disorder: Secondary | ICD-10-CM | POA: Diagnosis not present

## 2020-05-29 DIAGNOSIS — G4730 Sleep apnea, unspecified: Secondary | ICD-10-CM | POA: Diagnosis not present

## 2020-05-29 DIAGNOSIS — Z1331 Encounter for screening for depression: Secondary | ICD-10-CM | POA: Diagnosis not present

## 2020-05-29 DIAGNOSIS — K219 Gastro-esophageal reflux disease without esophagitis: Secondary | ICD-10-CM | POA: Diagnosis not present

## 2020-05-29 DIAGNOSIS — R000 Tachycardia, unspecified: Secondary | ICD-10-CM | POA: Diagnosis not present

## 2020-05-29 DIAGNOSIS — Z0001 Encounter for general adult medical examination with abnormal findings: Secondary | ICD-10-CM | POA: Diagnosis not present

## 2020-05-29 DIAGNOSIS — Z6834 Body mass index (BMI) 34.0-34.9, adult: Secondary | ICD-10-CM | POA: Diagnosis not present

## 2020-05-29 DIAGNOSIS — E7849 Other hyperlipidemia: Secondary | ICD-10-CM | POA: Diagnosis not present

## 2020-05-29 DIAGNOSIS — Z23 Encounter for immunization: Secondary | ICD-10-CM | POA: Diagnosis not present

## 2020-06-14 DIAGNOSIS — Z23 Encounter for immunization: Secondary | ICD-10-CM | POA: Diagnosis not present

## 2020-10-04 DIAGNOSIS — B356 Tinea cruris: Secondary | ICD-10-CM | POA: Diagnosis not present

## 2020-10-04 DIAGNOSIS — D225 Melanocytic nevi of trunk: Secondary | ICD-10-CM | POA: Diagnosis not present

## 2020-10-04 DIAGNOSIS — Z1283 Encounter for screening for malignant neoplasm of skin: Secondary | ICD-10-CM | POA: Diagnosis not present

## 2020-10-04 DIAGNOSIS — X32XXXA Exposure to sunlight, initial encounter: Secondary | ICD-10-CM | POA: Diagnosis not present

## 2020-10-04 DIAGNOSIS — L570 Actinic keratosis: Secondary | ICD-10-CM | POA: Diagnosis not present

## 2020-11-20 DIAGNOSIS — Z23 Encounter for immunization: Secondary | ICD-10-CM | POA: Diagnosis not present

## 2020-11-27 DIAGNOSIS — G4730 Sleep apnea, unspecified: Secondary | ICD-10-CM | POA: Diagnosis not present

## 2021-02-06 DIAGNOSIS — M545 Low back pain, unspecified: Secondary | ICD-10-CM | POA: Diagnosis not present

## 2021-02-06 DIAGNOSIS — G8929 Other chronic pain: Secondary | ICD-10-CM | POA: Diagnosis not present

## 2021-04-03 DIAGNOSIS — G43719 Chronic migraine without aura, intractable, without status migrainosus: Secondary | ICD-10-CM | POA: Diagnosis not present

## 2021-04-18 DIAGNOSIS — Z23 Encounter for immunization: Secondary | ICD-10-CM | POA: Diagnosis not present

## 2021-04-22 DIAGNOSIS — M9903 Segmental and somatic dysfunction of lumbar region: Secondary | ICD-10-CM | POA: Diagnosis not present

## 2021-04-22 DIAGNOSIS — M9902 Segmental and somatic dysfunction of thoracic region: Secondary | ICD-10-CM | POA: Diagnosis not present

## 2021-04-22 DIAGNOSIS — M5136 Other intervertebral disc degeneration, lumbar region: Secondary | ICD-10-CM | POA: Diagnosis not present

## 2021-04-22 DIAGNOSIS — M5134 Other intervertebral disc degeneration, thoracic region: Secondary | ICD-10-CM | POA: Diagnosis not present

## 2021-04-24 DIAGNOSIS — M9903 Segmental and somatic dysfunction of lumbar region: Secondary | ICD-10-CM | POA: Diagnosis not present

## 2021-04-24 DIAGNOSIS — M9902 Segmental and somatic dysfunction of thoracic region: Secondary | ICD-10-CM | POA: Diagnosis not present

## 2021-04-24 DIAGNOSIS — M5134 Other intervertebral disc degeneration, thoracic region: Secondary | ICD-10-CM | POA: Diagnosis not present

## 2021-04-24 DIAGNOSIS — M5136 Other intervertebral disc degeneration, lumbar region: Secondary | ICD-10-CM | POA: Diagnosis not present

## 2021-04-25 DIAGNOSIS — M5136 Other intervertebral disc degeneration, lumbar region: Secondary | ICD-10-CM | POA: Diagnosis not present

## 2021-04-25 DIAGNOSIS — M9902 Segmental and somatic dysfunction of thoracic region: Secondary | ICD-10-CM | POA: Diagnosis not present

## 2021-04-25 DIAGNOSIS — M9903 Segmental and somatic dysfunction of lumbar region: Secondary | ICD-10-CM | POA: Diagnosis not present

## 2021-04-25 DIAGNOSIS — M5134 Other intervertebral disc degeneration, thoracic region: Secondary | ICD-10-CM | POA: Diagnosis not present

## 2021-04-29 DIAGNOSIS — M9903 Segmental and somatic dysfunction of lumbar region: Secondary | ICD-10-CM | POA: Diagnosis not present

## 2021-04-29 DIAGNOSIS — M9902 Segmental and somatic dysfunction of thoracic region: Secondary | ICD-10-CM | POA: Diagnosis not present

## 2021-04-29 DIAGNOSIS — M5134 Other intervertebral disc degeneration, thoracic region: Secondary | ICD-10-CM | POA: Diagnosis not present

## 2021-04-29 DIAGNOSIS — M5136 Other intervertebral disc degeneration, lumbar region: Secondary | ICD-10-CM | POA: Diagnosis not present

## 2021-05-01 DIAGNOSIS — M9903 Segmental and somatic dysfunction of lumbar region: Secondary | ICD-10-CM | POA: Diagnosis not present

## 2021-05-01 DIAGNOSIS — M9902 Segmental and somatic dysfunction of thoracic region: Secondary | ICD-10-CM | POA: Diagnosis not present

## 2021-05-01 DIAGNOSIS — M5136 Other intervertebral disc degeneration, lumbar region: Secondary | ICD-10-CM | POA: Diagnosis not present

## 2021-05-01 DIAGNOSIS — M5134 Other intervertebral disc degeneration, thoracic region: Secondary | ICD-10-CM | POA: Diagnosis not present

## 2021-05-02 DIAGNOSIS — M9902 Segmental and somatic dysfunction of thoracic region: Secondary | ICD-10-CM | POA: Diagnosis not present

## 2021-05-02 DIAGNOSIS — M5134 Other intervertebral disc degeneration, thoracic region: Secondary | ICD-10-CM | POA: Diagnosis not present

## 2021-05-02 DIAGNOSIS — M5136 Other intervertebral disc degeneration, lumbar region: Secondary | ICD-10-CM | POA: Diagnosis not present

## 2021-05-02 DIAGNOSIS — M9903 Segmental and somatic dysfunction of lumbar region: Secondary | ICD-10-CM | POA: Diagnosis not present

## 2021-05-06 DIAGNOSIS — M9903 Segmental and somatic dysfunction of lumbar region: Secondary | ICD-10-CM | POA: Diagnosis not present

## 2021-05-06 DIAGNOSIS — M9902 Segmental and somatic dysfunction of thoracic region: Secondary | ICD-10-CM | POA: Diagnosis not present

## 2021-05-06 DIAGNOSIS — M5136 Other intervertebral disc degeneration, lumbar region: Secondary | ICD-10-CM | POA: Diagnosis not present

## 2021-05-06 DIAGNOSIS — M5134 Other intervertebral disc degeneration, thoracic region: Secondary | ICD-10-CM | POA: Diagnosis not present

## 2021-05-08 DIAGNOSIS — M5136 Other intervertebral disc degeneration, lumbar region: Secondary | ICD-10-CM | POA: Diagnosis not present

## 2021-05-08 DIAGNOSIS — M9902 Segmental and somatic dysfunction of thoracic region: Secondary | ICD-10-CM | POA: Diagnosis not present

## 2021-05-08 DIAGNOSIS — M9903 Segmental and somatic dysfunction of lumbar region: Secondary | ICD-10-CM | POA: Diagnosis not present

## 2021-05-08 DIAGNOSIS — M5134 Other intervertebral disc degeneration, thoracic region: Secondary | ICD-10-CM | POA: Diagnosis not present

## 2021-05-09 DIAGNOSIS — M5136 Other intervertebral disc degeneration, lumbar region: Secondary | ICD-10-CM | POA: Diagnosis not present

## 2021-05-09 DIAGNOSIS — M9903 Segmental and somatic dysfunction of lumbar region: Secondary | ICD-10-CM | POA: Diagnosis not present

## 2021-05-09 DIAGNOSIS — M5134 Other intervertebral disc degeneration, thoracic region: Secondary | ICD-10-CM | POA: Diagnosis not present

## 2021-05-09 DIAGNOSIS — M9902 Segmental and somatic dysfunction of thoracic region: Secondary | ICD-10-CM | POA: Diagnosis not present

## 2021-05-13 DIAGNOSIS — M5134 Other intervertebral disc degeneration, thoracic region: Secondary | ICD-10-CM | POA: Diagnosis not present

## 2021-05-13 DIAGNOSIS — M5136 Other intervertebral disc degeneration, lumbar region: Secondary | ICD-10-CM | POA: Diagnosis not present

## 2021-05-13 DIAGNOSIS — M9903 Segmental and somatic dysfunction of lumbar region: Secondary | ICD-10-CM | POA: Diagnosis not present

## 2021-05-13 DIAGNOSIS — M9902 Segmental and somatic dysfunction of thoracic region: Secondary | ICD-10-CM | POA: Diagnosis not present

## 2021-05-15 DIAGNOSIS — M5136 Other intervertebral disc degeneration, lumbar region: Secondary | ICD-10-CM | POA: Diagnosis not present

## 2021-05-15 DIAGNOSIS — M9903 Segmental and somatic dysfunction of lumbar region: Secondary | ICD-10-CM | POA: Diagnosis not present

## 2021-05-15 DIAGNOSIS — M5134 Other intervertebral disc degeneration, thoracic region: Secondary | ICD-10-CM | POA: Diagnosis not present

## 2021-05-15 DIAGNOSIS — M9902 Segmental and somatic dysfunction of thoracic region: Secondary | ICD-10-CM | POA: Diagnosis not present

## 2021-06-17 DIAGNOSIS — E6609 Other obesity due to excess calories: Secondary | ICD-10-CM | POA: Diagnosis not present

## 2021-06-17 DIAGNOSIS — Z125 Encounter for screening for malignant neoplasm of prostate: Secondary | ICD-10-CM | POA: Diagnosis not present

## 2021-06-17 DIAGNOSIS — Z6831 Body mass index (BMI) 31.0-31.9, adult: Secondary | ICD-10-CM | POA: Diagnosis not present

## 2021-06-17 DIAGNOSIS — E7849 Other hyperlipidemia: Secondary | ICD-10-CM | POA: Diagnosis not present

## 2021-06-17 DIAGNOSIS — Z0000 Encounter for general adult medical examination without abnormal findings: Secondary | ICD-10-CM | POA: Diagnosis not present

## 2021-06-17 DIAGNOSIS — G4730 Sleep apnea, unspecified: Secondary | ICD-10-CM | POA: Diagnosis not present

## 2021-06-17 DIAGNOSIS — Z1331 Encounter for screening for depression: Secondary | ICD-10-CM | POA: Diagnosis not present

## 2021-06-17 DIAGNOSIS — E785 Hyperlipidemia, unspecified: Secondary | ICD-10-CM | POA: Diagnosis not present

## 2021-06-26 DIAGNOSIS — G4733 Obstructive sleep apnea (adult) (pediatric): Secondary | ICD-10-CM | POA: Diagnosis not present

## 2021-10-23 DIAGNOSIS — G43719 Chronic migraine without aura, intractable, without status migrainosus: Secondary | ICD-10-CM | POA: Diagnosis not present

## 2021-12-24 DIAGNOSIS — G4733 Obstructive sleep apnea (adult) (pediatric): Secondary | ICD-10-CM | POA: Diagnosis not present

## 2022-01-20 DIAGNOSIS — M79645 Pain in left finger(s): Secondary | ICD-10-CM | POA: Diagnosis not present

## 2022-01-20 DIAGNOSIS — S62665A Nondisplaced fracture of distal phalanx of left ring finger, initial encounter for closed fracture: Secondary | ICD-10-CM | POA: Diagnosis not present

## 2022-04-28 DIAGNOSIS — H11153 Pinguecula, bilateral: Secondary | ICD-10-CM | POA: Diagnosis not present

## 2022-04-28 DIAGNOSIS — H35033 Hypertensive retinopathy, bilateral: Secondary | ICD-10-CM | POA: Diagnosis not present

## 2022-04-28 DIAGNOSIS — H524 Presbyopia: Secondary | ICD-10-CM | POA: Diagnosis not present

## 2022-04-28 DIAGNOSIS — H2513 Age-related nuclear cataract, bilateral: Secondary | ICD-10-CM | POA: Diagnosis not present

## 2022-04-28 DIAGNOSIS — H02831 Dermatochalasis of right upper eyelid: Secondary | ICD-10-CM | POA: Diagnosis not present

## 2022-04-30 DIAGNOSIS — L570 Actinic keratosis: Secondary | ICD-10-CM | POA: Diagnosis not present

## 2022-04-30 DIAGNOSIS — X32XXXD Exposure to sunlight, subsequent encounter: Secondary | ICD-10-CM | POA: Diagnosis not present

## 2022-04-30 DIAGNOSIS — D225 Melanocytic nevi of trunk: Secondary | ICD-10-CM | POA: Diagnosis not present

## 2022-04-30 DIAGNOSIS — Z1283 Encounter for screening for malignant neoplasm of skin: Secondary | ICD-10-CM | POA: Diagnosis not present

## 2022-04-30 DIAGNOSIS — D485 Neoplasm of uncertain behavior of skin: Secondary | ICD-10-CM | POA: Diagnosis not present

## 2022-06-18 DIAGNOSIS — G4489 Other headache syndrome: Secondary | ICD-10-CM | POA: Diagnosis not present

## 2022-06-18 DIAGNOSIS — Z6832 Body mass index (BMI) 32.0-32.9, adult: Secondary | ICD-10-CM | POA: Diagnosis not present

## 2022-06-18 DIAGNOSIS — G4730 Sleep apnea, unspecified: Secondary | ICD-10-CM | POA: Diagnosis not present

## 2022-06-18 DIAGNOSIS — Z0000 Encounter for general adult medical examination without abnormal findings: Secondary | ICD-10-CM | POA: Diagnosis not present

## 2022-06-18 DIAGNOSIS — E785 Hyperlipidemia, unspecified: Secondary | ICD-10-CM | POA: Diagnosis not present

## 2022-07-17 DIAGNOSIS — M79645 Pain in left finger(s): Secondary | ICD-10-CM | POA: Diagnosis not present

## 2022-10-24 DIAGNOSIS — J343 Hypertrophy of nasal turbinates: Secondary | ICD-10-CM | POA: Diagnosis not present

## 2022-10-24 DIAGNOSIS — H903 Sensorineural hearing loss, bilateral: Secondary | ICD-10-CM | POA: Diagnosis not present

## 2022-10-24 DIAGNOSIS — J324 Chronic pansinusitis: Secondary | ICD-10-CM | POA: Diagnosis not present

## 2022-12-12 DIAGNOSIS — H903 Sensorineural hearing loss, bilateral: Secondary | ICD-10-CM | POA: Diagnosis not present

## 2023-07-27 DIAGNOSIS — Z0001 Encounter for general adult medical examination with abnormal findings: Secondary | ICD-10-CM | POA: Diagnosis not present

## 2023-07-27 DIAGNOSIS — Z6832 Body mass index (BMI) 32.0-32.9, adult: Secondary | ICD-10-CM | POA: Diagnosis not present

## 2023-07-27 DIAGNOSIS — E6609 Other obesity due to excess calories: Secondary | ICD-10-CM | POA: Diagnosis not present

## 2023-07-27 DIAGNOSIS — G4489 Other headache syndrome: Secondary | ICD-10-CM | POA: Diagnosis not present

## 2023-07-27 DIAGNOSIS — E785 Hyperlipidemia, unspecified: Secondary | ICD-10-CM | POA: Diagnosis not present

## 2023-07-27 DIAGNOSIS — E349 Endocrine disorder, unspecified: Secondary | ICD-10-CM | POA: Diagnosis not present

## 2023-07-27 DIAGNOSIS — Z1331 Encounter for screening for depression: Secondary | ICD-10-CM | POA: Diagnosis not present

## 2023-08-11 DIAGNOSIS — L603 Nail dystrophy: Secondary | ICD-10-CM | POA: Diagnosis not present

## 2023-08-11 DIAGNOSIS — D225 Melanocytic nevi of trunk: Secondary | ICD-10-CM | POA: Diagnosis not present

## 2023-08-11 DIAGNOSIS — B353 Tinea pedis: Secondary | ICD-10-CM | POA: Diagnosis not present

## 2023-08-11 DIAGNOSIS — Z1283 Encounter for screening for malignant neoplasm of skin: Secondary | ICD-10-CM | POA: Diagnosis not present

## 2023-09-09 DIAGNOSIS — X32XXXD Exposure to sunlight, subsequent encounter: Secondary | ICD-10-CM | POA: Diagnosis not present

## 2023-09-09 DIAGNOSIS — L570 Actinic keratosis: Secondary | ICD-10-CM | POA: Diagnosis not present

## 2023-10-27 ENCOUNTER — Encounter (INDEPENDENT_AMBULATORY_CARE_PROVIDER_SITE_OTHER): Payer: Self-pay

## 2023-10-27 ENCOUNTER — Ambulatory Visit (INDEPENDENT_AMBULATORY_CARE_PROVIDER_SITE_OTHER): Payer: Federal, State, Local not specified - PPO | Admitting: Otolaryngology

## 2023-10-27 VITALS — BP 124/79 | HR 63 | Ht 75.0 in | Wt 250.0 lb

## 2023-10-27 DIAGNOSIS — R0981 Nasal congestion: Secondary | ICD-10-CM

## 2023-10-27 DIAGNOSIS — J310 Chronic rhinitis: Secondary | ICD-10-CM | POA: Diagnosis not present

## 2023-10-27 DIAGNOSIS — J343 Hypertrophy of nasal turbinates: Secondary | ICD-10-CM

## 2023-10-27 DIAGNOSIS — H903 Sensorineural hearing loss, bilateral: Secondary | ICD-10-CM | POA: Diagnosis not present

## 2023-10-27 DIAGNOSIS — H6123 Impacted cerumen, bilateral: Secondary | ICD-10-CM | POA: Diagnosis not present

## 2023-10-27 NOTE — Progress Notes (Unsigned)
 Patient ID: Kevin Patton, male   DOB: 1956-01-21, 68 y.o.   MRN: [id number]  Follow-up: Hearing loss, chronic rhinosinusitis, chronic nasal congestion  HPI: The patient is a 68 year old male who returns today for his follow-up evaluation.  The patient was previously seen for his bilateral high-frequency sensorineural hearing loss.  He was fitted with bilateral hearing aids.  In addition, the patient also has a history of chronic left sided rhinosinusitis and nasal obstruction.  He underwent endoscopic sinus surgery, septoplasty, and turbinate reduction in 08/2018.  The patient returns today reporting improvement in his nasal breathing since the sinus surgery.  He is able to breathe through both nostrils.  He has noted only mild nasal congestion during the allergy seasons.  He denies any recent change in his hearing.  He is still wearing bilateral hearing aids.  Exam: General: Communicates without difficulty, well nourished, no acute distress. Head: Normocephalic, no evidence injury, no tenderness, facial buttresses intact without stepoff. Face/sinus: No tenderness to palpation and percussion. Facial movement is normal and symmetric. Eyes: PERRL, EOMI. No scleral icterus, conjunctivae clear. Neuro: CN II exam reveals vision grossly intact.  No nystagmus at any point of gaze. Ears: Auricles well formed without lesions.  Bilateral cerumen impaction.  Nose: External evaluation reveals normal support and skin without lesions.  Dorsum is intact.  Anterior rhinoscopy reveals congested mucosa over anterior aspect of inferior turbinates and intact septum.  No purulence noted. Oral:  Oral cavity and oropharynx are intact, symmetric, without erythema or edema.  Mucosa is moist without lesions. Neck: Full range of motion without pain.  There is no significant lymphadenopathy.  No masses palpable.  Thyroid  bed within normal limits to palpation.  Parotid glands and submandibular glands equal bilaterally without mass.   Trachea is midline. Neuro:  CN 2-12 grossly intact.   Procedure: Bilateral cerumen disimpaction Anesthesia: None Description: Under the operating microscope, the cerumen is carefully removed with a combination of cerumen currette, alligator forceps, and suction catheters.  After the cerumen is removed, the TMs are noted to be normal.  No mass, erythema, or lesions. The patient tolerated the procedure well.    Assessment: 1.  Bilateral cerumen impaction.  After the cerumen removal procedure, both tympanic membranes and middle ear spaces are noted to be normal. 2.  Chronic rhinitis with mild nasal mucosal congestion.  No recurrent sinusitis is noted.  His septum and turbinates are well-healed. 3.  Subjectively stable bilateral high-frequency sensorineural hearing loss.  Plan: 1.  Otomicroscopy with bilateral cerumen disimpaction. 2.  The physical exam findings are reviewed with the patient. 3.  Flonase nasal spray 2 sprays each nostril daily. 4.  Continue the use of his hearing aids. 5.  The patient will return for reevaluation in 1 year.

## 2023-10-28 DIAGNOSIS — H903 Sensorineural hearing loss, bilateral: Secondary | ICD-10-CM | POA: Insufficient documentation

## 2023-10-28 DIAGNOSIS — J310 Chronic rhinitis: Secondary | ICD-10-CM | POA: Insufficient documentation

## 2023-10-28 DIAGNOSIS — H6123 Impacted cerumen, bilateral: Secondary | ICD-10-CM | POA: Insufficient documentation

## 2023-10-28 DIAGNOSIS — J343 Hypertrophy of nasal turbinates: Secondary | ICD-10-CM | POA: Insufficient documentation

## 2023-12-15 DIAGNOSIS — M79645 Pain in left finger(s): Secondary | ICD-10-CM | POA: Diagnosis not present

## 2024-02-12 DIAGNOSIS — M545 Low back pain, unspecified: Secondary | ICD-10-CM | POA: Diagnosis not present

## 2024-02-12 DIAGNOSIS — G8929 Other chronic pain: Secondary | ICD-10-CM | POA: Diagnosis not present

## 2024-02-12 DIAGNOSIS — Z1330 Encounter for screening examination for mental health and behavioral disorders, unspecified: Secondary | ICD-10-CM | POA: Diagnosis not present
